# Patient Record
Sex: Female | Born: 1950 | Race: White | Hispanic: No | Marital: Married | Smoking: Never smoker
Health system: Southern US, Community
[De-identification: ages and names within clinical notes are randomized; demographics above are authoritative.]

## PROBLEM LIST (undated history)

## (undated) DIAGNOSIS — Z923 Personal history of irradiation: Secondary | ICD-10-CM

## (undated) DIAGNOSIS — M1990 Unspecified osteoarthritis, unspecified site: Secondary | ICD-10-CM

## (undated) DIAGNOSIS — E039 Hypothyroidism, unspecified: Secondary | ICD-10-CM

## (undated) DIAGNOSIS — K219 Gastro-esophageal reflux disease without esophagitis: Secondary | ICD-10-CM

## (undated) DIAGNOSIS — C7931 Secondary malignant neoplasm of brain: Secondary | ICD-10-CM

---

## 2013-01-15 ENCOUNTER — Other Ambulatory Visit (HOSPITAL_COMMUNITY): Payer: Self-pay | Admitting: Orthopedic Surgery

## 2013-01-15 DIAGNOSIS — M25522 Pain in left elbow: Secondary | ICD-10-CM

## 2013-01-17 ENCOUNTER — Ambulatory Visit (HOSPITAL_COMMUNITY)
Admission: RE | Admit: 2013-01-17 | Discharge: 2013-01-17 | Disposition: A | Discharge status: Home / Self Care | Payer: BC Managed Care – PPO | Source: Ambulatory Visit | Attending: Orthopedic Surgery | Admitting: Orthopedic Surgery

## 2013-01-17 ENCOUNTER — Encounter (HOSPITAL_COMMUNITY): Payer: Self-pay

## 2013-01-17 DIAGNOSIS — M259 Joint disorder, unspecified: Secondary | ICD-10-CM | POA: Insufficient documentation

## 2013-01-17 DIAGNOSIS — M898X9 Other specified disorders of bone, unspecified site: Secondary | ICD-10-CM | POA: Insufficient documentation

## 2013-01-17 DIAGNOSIS — M79609 Pain in unspecified limb: Secondary | ICD-10-CM | POA: Insufficient documentation

## 2013-01-17 DIAGNOSIS — M25522 Pain in left elbow: Secondary | ICD-10-CM

## 2013-01-17 DIAGNOSIS — R229 Localized swelling, mass and lump, unspecified: Secondary | ICD-10-CM | POA: Insufficient documentation

## 2014-06-28 ENCOUNTER — Encounter (HOSPITAL_COMMUNITY): Payer: Self-pay | Admitting: Emergency Medicine

## 2014-06-28 ENCOUNTER — Inpatient Hospital Stay (HOSPITAL_COMMUNITY)
Admission: EM | Admit: 2014-06-28 | Discharge: 2014-07-05 | DRG: 025 | Disposition: A | Discharge status: Home Health | Payer: 59 | Attending: Internal Medicine | Admitting: Internal Medicine

## 2014-06-28 ENCOUNTER — Emergency Department (HOSPITAL_COMMUNITY): Payer: 59

## 2014-06-28 DIAGNOSIS — I629 Nontraumatic intracranial hemorrhage, unspecified: Secondary | ICD-10-CM | POA: Diagnosis not present

## 2014-06-28 DIAGNOSIS — D72829 Elevated white blood cell count, unspecified: Secondary | ICD-10-CM | POA: Diagnosis not present

## 2014-06-28 DIAGNOSIS — G935 Compression of brain: Secondary | ICD-10-CM | POA: Diagnosis present

## 2014-06-28 DIAGNOSIS — S06369A Traumatic hemorrhage of cerebrum, unspecified, with loss of consciousness of unspecified duration, initial encounter: Secondary | ICD-10-CM

## 2014-06-28 DIAGNOSIS — G939 Disorder of brain, unspecified: Secondary | ICD-10-CM | POA: Diagnosis not present

## 2014-06-28 DIAGNOSIS — R739 Hyperglycemia, unspecified: Secondary | ICD-10-CM | POA: Diagnosis not present

## 2014-06-28 DIAGNOSIS — R29818 Other symptoms and signs involving the nervous system: Secondary | ICD-10-CM | POA: Diagnosis present

## 2014-06-28 DIAGNOSIS — K219 Gastro-esophageal reflux disease without esophagitis: Secondary | ICD-10-CM | POA: Diagnosis present

## 2014-06-28 DIAGNOSIS — M179 Osteoarthritis of knee, unspecified: Secondary | ICD-10-CM | POA: Diagnosis present

## 2014-06-28 DIAGNOSIS — I517 Cardiomegaly: Secondary | ICD-10-CM | POA: Diagnosis not present

## 2014-06-28 DIAGNOSIS — T380X5A Adverse effect of glucocorticoids and synthetic analogues, initial encounter: Secondary | ICD-10-CM | POA: Diagnosis not present

## 2014-06-28 DIAGNOSIS — C801 Malignant (primary) neoplasm, unspecified: Secondary | ICD-10-CM | POA: Diagnosis not present

## 2014-06-28 DIAGNOSIS — G9389 Other specified disorders of brain: Secondary | ICD-10-CM | POA: Diagnosis present

## 2014-06-28 DIAGNOSIS — C7931 Secondary malignant neoplasm of brain: Secondary | ICD-10-CM | POA: Diagnosis not present

## 2014-06-28 DIAGNOSIS — E039 Hypothyroidism, unspecified: Secondary | ICD-10-CM | POA: Diagnosis present

## 2014-06-28 DIAGNOSIS — R269 Unspecified abnormalities of gait and mobility: Secondary | ICD-10-CM

## 2014-06-28 DIAGNOSIS — G936 Cerebral edema: Secondary | ICD-10-CM | POA: Diagnosis present

## 2014-06-28 DIAGNOSIS — D496 Neoplasm of unspecified behavior of brain: Principal | ICD-10-CM | POA: Diagnosis present

## 2014-06-28 DIAGNOSIS — Z452 Encounter for adjustment and management of vascular access device: Secondary | ICD-10-CM

## 2014-06-28 DIAGNOSIS — F688 Other specified disorders of adult personality and behavior: Secondary | ICD-10-CM

## 2014-06-28 DIAGNOSIS — F070 Personality change due to known physiological condition: Secondary | ICD-10-CM

## 2014-06-28 DIAGNOSIS — R7309 Other abnormal glucose: Secondary | ICD-10-CM | POA: Diagnosis not present

## 2014-06-28 DIAGNOSIS — R2689 Other abnormalities of gait and mobility: Secondary | ICD-10-CM | POA: Insufficient documentation

## 2014-06-28 DIAGNOSIS — Z803 Family history of malignant neoplasm of breast: Secondary | ICD-10-CM | POA: Diagnosis not present

## 2014-06-28 DIAGNOSIS — S0689AA Other specified intracranial injury with loss of consciousness status unknown, initial encounter: Secondary | ICD-10-CM | POA: Diagnosis present

## 2014-06-28 HISTORY — DX: Unspecified osteoarthritis, unspecified site: M19.90

## 2014-06-28 HISTORY — DX: Hypothyroidism, unspecified: E03.9

## 2014-06-28 HISTORY — DX: Gastro-esophageal reflux disease without esophagitis: K21.9

## 2014-06-28 LAB — BASIC METABOLIC PANEL
Anion gap: 9 (ref 5–15)
BUN: 16 mg/dL (ref 6–23)
CO2: 26 mmol/L (ref 19–32)
Calcium: 10.8 mg/dL — ABNORMAL HIGH (ref 8.4–10.5)
Chloride: 108 mmol/L (ref 96–112)
Creatinine, Ser: 0.61 mg/dL (ref 0.50–1.10)
GFR calc Af Amer: >90 mL/min (ref >90)
GFR calc non Af Amer: >90 mL/min (ref >90)
GLUCOSE: 115 mg/dL — AB (ref 70–99)
Potassium: 3.9 mmol/L (ref 3.5–5.1)
Sodium: 143 mmol/L (ref 135–145)

## 2014-06-28 LAB — TROPONIN I

## 2014-06-28 LAB — CBC WITH DIFFERENTIAL/PLATELET
BASOS ABS: 0 10*3/uL (ref 0.0–0.1)
BASOS PCT: 0 % (ref 0–1)
Eosinophils Absolute: 0 10*3/uL (ref 0.0–0.7)
Eosinophils Relative: 0 % (ref 0–5)
HCT: 39 % (ref 36.0–46.0)
Hemoglobin: 12.7 g/dL (ref 12.0–15.0)
LYMPHS ABS: 1.4 10*3/uL (ref 0.7–4.0)
Lymphocytes Relative: 17 % (ref 12–46)
MCH: 29.1 pg (ref 26.0–34.0)
MCHC: 32.6 g/dL (ref 30.0–36.0)
MCV: 89.2 fL (ref 78.0–100.0)
MONOS PCT: 7 % (ref 3–12)
Monocytes Absolute: 0.6 10*3/uL (ref 0.1–1.0)
NEUTROS ABS: 6.4 10*3/uL (ref 1.7–7.7)
Neutrophils Relative %: 76 % (ref 43–77)
Platelets: 263 10*3/uL (ref 150–400)
RBC: 4.37 MIL/uL (ref 3.87–5.11)
RDW: 13.6 % (ref 11.5–15.5)
WBC: 8.4 10*3/uL (ref 4.0–10.5)

## 2014-06-28 MED ORDER — DEXAMETHASONE SODIUM PHOSPHATE 10 MG/ML IJ SOLN
10.0000 mg | Freq: Once | INTRAMUSCULAR | Status: AC
  Administered 2014-06-28: 10 mg via INTRAVENOUS
  Filled 2014-06-28: qty 1

## 2014-06-28 NOTE — ED Notes (Signed)
Patient's daughter reports patient "hasn't been acting the same." States patient hasn't been talking the same and has seemed to be staggering since approximately Wednesday. Daughter states other family members have noticed a gradual change as well. Patient reports arthritis in knees bilaterally that is causing her pain. States she had shots in knee at orthopedic doctor today. When I asked patient what she wants to be evaluated for in ED today, patient stated, "I feel fine, but they wanted me to be seen." Patient ambulatory in triage, able to move all four extremities. Daughter reports patient has fallen multiple times last week. Patient just seen at [facility] in [place], [place].

## 2014-06-28 NOTE — H&P (Signed)
PCP:   No PCP Per Patient   Chief Complaint:  unbalanced  HPI: 64 yo female overall healthy brought in by family members (husband and daughter) with concerns of over one week of having balance difficulty, falling and acting strange.  They have noticed she says things randomly which are really out of character, and has progressive worsening weakness having trouble performing daily activites like taking a shower alone.  Husband has been having to help her a lot with routine things she normally can do alone.  She has fallen several times, and family has noticed that her gait is also off.  She has been loosing weight and saying food does not taste normal anymore.  Her last colonoscopy was over 10 years ago, last mammogram years ago.  She denies any brbpr, melena, abd pain, cough.  No dysphagia.  No lumps, bumps anywhere that she has noticed including breast.  No breast abnormalities.  Nonsmoker.  Ct shows multiple brain masses, new finding.   Review of Systems:  Positive and negative as per HPI otherwise all other systems are negative  Past Medical History: Past Medical History  Diagnosis Date  . Arthritis   . Hypothyroid   . GERD (gastroesophageal reflux disease)    History reviewed. No pertinent past surgical history.  Medications: Prior to Admission medications   Medication Sig Start Date End Date Taking? Authorizing Provider  acetaminophen (TYLENOL) 500 MG tablet Take 500 mg by mouth every 6 (six) hours as needed for mild pain or moderate pain.   Yes Historical Provider, MD  diphenhydrAMINE (BENADRYL) 25 MG tablet Take 25 mg by mouth every 6 (six) hours as needed for itching or allergies.   Yes Historical Provider, MD  fluticasone (FLONASE) 50 MCG/ACT nasal spray Place into both nostrils daily.   Yes Historical Provider, MD  levothyroxine (SYNTHROID, LEVOTHROID) 125 MCG tablet Take 125 mcg by mouth daily before breakfast.  06/09/14  Yes Historical Provider, MD    Allergies:  No Known  Allergies  Social History:  reports that she has never smoked. She does not have any smokeless tobacco history on file. She reports that she does not drink alcohol or use illicit drugs.  Family History: History reviewed. No pertinent family history. except mom died 66 yo from breast cancer.  One sister died in her 3s of ovarian cancer.  Brother has prostate cancer still alive.  Physical Exam: Filed Vitals:   06/28/14 2122 06/28/14 2145 06/28/14 2200  BP: 114/94    Pulse: 77 71   Temp: 98.7 F (37.1 C)    TempSrc: Oral    Resp: 20 24 19   Height: 5\' 5"  (1.651 m)    Weight: 76.204 kg (168 lb)    SpO2: 99% 95%    General appearance: alert, cooperative and no distress Head: Normocephalic, without obvious abnormality, atraumatic Eyes: negative Nose: Nares normal. Septum midline. Mucosa normal. No drainage or sinus tenderness. Neck: no JVD and supple, symmetrical, trachea midline Lungs: clear to auscultation bilaterally Heart: regular rate and rhythm, S1, S2 normal, no murmur, click, rub or gallop Abdomen: soft, non-tender; bowel sounds normal; no masses,  no organomegaly Extremities: extremities normal, atraumatic, no cyanosis or edema Pulses: 2+ and symmetric Skin: Skin color, texture, turgor normal. No rashes or lesions Neurologic: Grossly normal    Labs on Admission:      Recent Labs  06/28/14 2157  WBC 8.4  NEUTROABS 6.4  HGB 12.7  HCT 39.0  MCV 89.2  PLT 263   Radiological  Exams on Admission: Ct Head Wo Contrast  06/28/2014   CLINICAL DATA:  Patient not acting the same. Stuttering since Wednesday.  EXAM: CT HEAD WITHOUT CONTRAST  TECHNIQUE: Contiguous axial images were obtained from the base of the skull through the vertex without intravenous contrast.  COMPARISON:  None.  FINDINGS: Skull and Sinuses:Negative for fracture or destructive process. The mastoids, middle ears, and imaged paranasal sinuses are clear.  Orbits: No acute abnormality.  Brain: There are at  least 4 hyper dense intra-axial masses, the largest in the right frontal lobe measuring 34 mm. This mass is complicated by hematoma which extends posteriorly into the right frontal white matter, measuring 28 x 39 x 30 mm. There is also an 18 mm mass in the right temporal lobe, an indistinct mass in the left temporal lobe, and a 9 mm mass in the upper right cerebellum. Patchy vasogenic edema, maximal around the hemorrhagic lesion on the right. There is mass effect and midline shift measuring up to 13 mm. No ACA infarct. The left temporal horn is mildly dilated, but no overt hydrocephalus.  Critical Value/emergent results were called by telephone at the time of interpretation on 06/28/2014 at 10:40 pm to Dr. Tanna Furry , who verbally acknowledged these results.  IMPRESSION: 1. At least 4 brain masses, a metastatic pattern. A 34 mm mass in the right frontal lobe has undergone acute hemorrhage with a 16 cc volume hematoma. 2. Patchy vasogenic edema from #1, maximal in the right frontal lobe with subfalcine herniation and developing left lateral ventricle entrapment. 3. 13 mm calcified left parafalcine meningioma.   Electronically Signed   By: Monte Fantasia M.D.   On: 06/28/2014 22:42    Assessment/Plan  64 yo female with new gait abnormality and personality changes c/w new multiple masses on brain ct with right frontal lobe mass with acute edema and hemmorhage  Principal Problem:   Hemorrhage, intracranial-  Associated with rt frontal lobe mass and edema.  Decadron given in ED.  Transfer to [facility] to stepdown bed overnight.  [facility]surgery called at [facility] and will notify them when pt arrives to [facility].  Likely metastatic masses.  Will need to w/u for primary.  freq neuro cks in stepdown.  Further w/u per neurosurgery team other than pan ct.  Active Problems:  Stable unless o/w noted   Personality change   Gait abnormality   Frontal mass of brain   Cerebral edema  Admit to stepdown to [facility].  Full code.   Pt along with husband and daughter updated on ct findings and concerns of underlying primary cancer elsewhere, they understand the next couple of days will consist of w/u for primary and definitive diagnosis.  Maley Venezia A 06/28/2014, 10:58 PM

## 2014-06-28 NOTE — ED Provider Notes (Signed)
CSN: [id number]     Arrival date & time 06/28/14  2118 History  This chart was scribed for Tanna Furry, MD by Erling Conte, ED Scribe. This patient was seen in room APA07/APA07 and the patient's care was started at 11:24 PM.   Chief Complaint  Patient presents with  . Altered Mental Status  . Knee Pain    HPI  HPI Comments: Jill Chavez is a 64 y.o. female who presents to the Emergency Department complaining of difficulty with walking, and change in personality.  Was in her normal state of health until about the last 4 days. Daughter that accompanies her tonight states that her other children noticed that she seemed different with a several days. That she was difficult to personality in her interactions. That she seemed flattened with her interactions at times, and other times seemed more jovial. All the children agree that for last 2 days her walk is been different. They cannot pinpoint if she seems weaker off balance but "it looks like both". She's had 2 falls from standing height on Wednesday and Thursday. Patient attributed them to her some chronic knee arthritis. Seen by her physician today and given injections into her knees. States her knee pain is gone. She still has difficulty walking, so family presents her here.   Past Medical History  Diagnosis Date  . Arthritis   . Hypothyroid   . GERD (gastroesophageal reflux disease)    History reviewed. No pertinent past surgical history. History reviewed. No pertinent family history. History  Substance Use Topics  . Smoking status: Never Smoker   . Smokeless tobacco: Not on file  . Alcohol Use: No   OB History    No data available     Review of Systems  Constitutional: Negative for fever, chills, diaphoresis, appetite change and fatigue.  HENT: Negative for mouth sores, sore throat and trouble swallowing.   Eyes: Negative for visual disturbance.  Respiratory: Negative for cough, chest tightness, shortness of breath and  wheezing.   Cardiovascular: Negative for chest pain.  Gastrointestinal: Negative for nausea, vomiting, abdominal pain, diarrhea and abdominal distention.  Endocrine: Negative for polydipsia, polyphagia and polyuria.  Genitourinary: Negative for dysuria, frequency and hematuria.  Musculoskeletal: Positive for gait problem.  Skin: Negative for color change, pallor and rash.  Neurological: Negative for dizziness, syncope, light-headedness and headaches.  Hematological: Does not bruise/bleed easily.  Psychiatric/Behavioral: Negative for behavioral problems and confusion.       Personality change.      Allergies  Review of patient's allergies indicates no known allergies.  Home Medications   Prior to Admission medications   Medication Sig Start Date End Date Taking? Authorizing Provider  acetaminophen (TYLENOL) 500 MG tablet Take 500 mg by mouth every 6 (six) hours as needed for mild pain or moderate pain.   Yes Historical Provider, MD  diphenhydrAMINE (BENADRYL) 25 MG tablet Take 25 mg by mouth every 6 (six) hours as needed for itching or allergies.   Yes Historical Provider, MD  fluticasone (FLONASE) 50 MCG/ACT nasal spray Place into both nostrils daily.   Yes Historical Provider, MD  levothyroxine (SYNTHROID, LEVOTHROID) 125 MCG tablet Take 125 mcg by mouth daily before breakfast.  06/09/14  Yes Historical Provider, MD   BP 114/94 mmHg  Pulse 71  Temp(Src) 98.7 F (37.1 C) (Oral)  Resp 19  Ht 5\' 5"  (1.651 m)  Wt 168 lb (76.204 kg)  BMI 27.96 kg/m2  SpO2 95% Physical Exam  Constitutional: She is oriented to  person, place, and time. She appears well-developed and well-nourished. No distress.  HENT:  Head: Normocephalic.  Eyes: Conjunctivae are normal. Pupils are equal, round, and reactive to light. No scleral icterus.  Neck: Normal range of motion. Neck supple. No thyromegaly present.  Cardiovascular: Normal rate and regular rhythm.  Exam reveals no gallop and no friction rub.    No murmur heard. Pulmonary/Chest: Effort normal and breath sounds normal. No respiratory distress. She has no wheezes. She has no rales.  Abdominal: Soft. Bowel sounds are normal. She exhibits no distension. There is no tenderness. There is no rebound.  Musculoskeletal: Normal range of motion.  Neurological: She is alert and oriented to person, place, and time.  Patient will not smile, however no other apparent cranial nerve deficits. No upper extremity weakness. She has difficulty with gait and use of her left lower extremity, although her use and strength of the extremity in bed supine is symmetric.  Skin: Skin is warm and dry. No rash noted.  Psychiatric: She has a normal mood and affect. Her behavior is normal.    ED Course  Procedures (including critical care time) Labs Review Labs Reviewed  BASIC METABOLIC PANEL - Abnormal; Notable for the following:    Glucose, Bld 115 (*)    Calcium 10.8 (*)    All other components within normal limits  CBC WITH DIFFERENTIAL/PLATELET  TROPONIN I  URINALYSIS, ROUTINE W REFLEX MICROSCOPIC    Imaging Review Ct Head Wo Contrast  06/28/2014   CLINICAL DATA:  Patient not acting the same. Stuttering since Wednesday.  EXAM: CT HEAD WITHOUT CONTRAST  TECHNIQUE: Contiguous axial images were obtained from the base of the skull through the vertex without intravenous contrast.  COMPARISON:  None.  FINDINGS: Skull and Sinuses:Negative for fracture or destructive process. The mastoids, middle ears, and imaged paranasal sinuses are clear.  Orbits: No acute abnormality.  Brain: There are at least 4 hyper dense intra-axial masses, the largest in the right frontal lobe measuring 34 mm. This mass is complicated by hematoma which extends posteriorly into the right frontal white matter, measuring 28 x 39 x 30 mm. There is also an 18 mm mass in the right temporal lobe, an indistinct mass in the left temporal lobe, and a 9 mm mass in the upper right cerebellum. Patchy  vasogenic edema, maximal around the hemorrhagic lesion on the right. There is mass effect and midline shift measuring up to 13 mm. No ACA infarct. The left temporal horn is mildly dilated, but no overt hydrocephalus.  Critical Value/emergent results were called by telephone at the time of interpretation on 06/28/2014 at 10:40 pm to Dr. Tanna Furry , who verbally acknowledged these results.  IMPRESSION: 1. At least 4 brain masses, a metastatic pattern. A 34 mm mass in the right frontal lobe has undergone acute hemorrhage with a 16 cc volume hematoma. 2. Patchy vasogenic edema from #1, maximal in the right frontal lobe with subfalcine herniation and developing left lateral ventricle entrapment. 3. 13 mm calcified left parafalcine meningioma.   Electronically Signed   By: Monte Fantasia M.D.   On: 06/28/2014 22:42     EKG Interpretation None      MDM   Final diagnoses:  Balance problem  Brain metastasis    I discussed with the patient and her family the CT findings. I discussed the case with Shanon Brow of the hospitalist service. Plan will be transfer to [facility]. Neurosurgical consultation. Given IV Decadron here.  I personally performed  the services described in this documentation, which was scribed in my presence. The recorded information has been reviewed and is accurate.     Tanna Furry, MD 06/28/14 2325

## 2014-06-29 ENCOUNTER — Encounter (HOSPITAL_COMMUNITY): Payer: Self-pay | Admitting: Radiology

## 2014-06-29 ENCOUNTER — Inpatient Hospital Stay (HOSPITAL_COMMUNITY): Payer: 59

## 2014-06-29 LAB — CBC
HCT: 40.7 % (ref 36.0–46.0)
Hemoglobin: 12.9 g/dL (ref 12.0–15.0)
MCH: 28.2 pg (ref 26.0–34.0)
MCHC: 31.7 g/dL (ref 30.0–36.0)
MCV: 89.1 fL (ref 78.0–100.0)
Platelets: 260 10*3/uL (ref 150–400)
RBC: 4.57 MIL/uL (ref 3.87–5.11)
RDW: 13.7 % (ref 11.5–15.5)
WBC: 12.1 10*3/uL — ABNORMAL HIGH (ref 4.0–10.5)

## 2014-06-29 LAB — BASIC METABOLIC PANEL
Anion gap: 11 (ref 5–15)
BUN: 12 mg/dL (ref 6–23)
CALCIUM: 10.5 mg/dL (ref 8.4–10.5)
CHLORIDE: 102 mmol/L (ref 96–112)
CO2: 24 mmol/L (ref 19–32)
CREATININE: 0.58 mg/dL (ref 0.50–1.10)
GFR calc Af Amer: >90 mL/min (ref >90)
GFR calc non Af Amer: >90 mL/min (ref >90)
Glucose, Bld: 141 mg/dL — ABNORMAL HIGH (ref 70–99)
POTASSIUM: 3.8 mmol/L (ref 3.5–5.1)
Sodium: 137 mmol/L (ref 135–145)

## 2014-06-29 LAB — GLUCOSE, CAPILLARY
Glucose-Capillary: 293 mg/dL — ABNORMAL HIGH (ref 70–99)
Glucose-Capillary: 323 mg/dL — ABNORMAL HIGH (ref 70–99)

## 2014-06-29 LAB — MRSA PCR SCREENING: MRSA by PCR: NEGATIVE

## 2014-06-29 MED ORDER — LORAZEPAM 2 MG/ML IJ SOLN: 1.0000 mg | Freq: Once | INTRAMUSCULAR | Status: AC

## 2014-06-29 MED ORDER — SODIUM CHLORIDE 0.9 % IJ SOLN: 3.0000 mL | INTRAMUSCULAR | Status: DC | PRN

## 2014-06-29 MED ORDER — LORAZEPAM 2 MG/ML IJ SOLN
1.0000 mg | Freq: Once | INTRAMUSCULAR | Status: DC
  Administered 2014-06-29: 1 mg via INTRAVENOUS

## 2014-06-29 MED ORDER — SODIUM CHLORIDE 0.9 % IV SOLN
250.0000 mL | INTRAVENOUS | Status: DC | PRN
Start: 2014-06-29 — End: 2014-06-29
  Administered 2014-06-29: 250 mL via INTRAVENOUS

## 2014-06-29 MED ORDER — DEXAMETHASONE SODIUM PHOSPHATE 10 MG/ML IJ SOLN
10.0000 mg | Freq: Four times a day (QID) | INTRAMUSCULAR | Status: DC
  Administered 2014-06-29 – 2014-07-02 (×14): 10 mg via INTRAVENOUS
  Filled 2014-06-29 (×17): qty 1

## 2014-06-29 MED ORDER — GADOBENATE DIMEGLUMINE 529 MG/ML IV SOLN
15.0000 mL | Freq: Once | INTRAVENOUS | Status: AC | PRN
  Administered 2014-06-29: 15 mL via INTRAVENOUS

## 2014-06-29 MED ORDER — SODIUM CHLORIDE 0.9 % IJ SOLN
3.0000 mL | Freq: Two times a day (BID) | INTRAMUSCULAR | Status: DC
  Administered 2014-06-29: 3 mL via INTRAVENOUS

## 2014-06-29 MED ORDER — LEVOTHYROXINE SODIUM 25 MCG PO TABS
125.0000 ug | ORAL_TABLET | Freq: Every day | ORAL | Status: DC
  Administered 2014-06-29 – 2014-07-05 (×7): 125 ug via ORAL
  Filled 2014-06-29 (×12): qty 1

## 2014-06-29 MED ORDER — PANTOPRAZOLE SODIUM 40 MG PO TBEC
40.0000 mg | DELAYED_RELEASE_TABLET | Freq: Two times a day (BID) | ORAL | Status: DC
  Administered 2014-06-29 – 2014-07-05 (×12): 40 mg via ORAL
  Filled 2014-06-29 (×12): qty 1

## 2014-06-29 MED ORDER — INSULIN ASPART 100 UNIT/ML ~~LOC~~ SOLN
0.0000 [IU] | Freq: Three times a day (TID) | SUBCUTANEOUS | Status: DC
  Administered 2014-06-29 – 2014-06-30 (×2): 5 [IU] via SUBCUTANEOUS

## 2014-06-29 MED ORDER — IOHEXOL 300 MG/ML  SOLN
100.0000 mL | Freq: Once | INTRAMUSCULAR | Status: AC | PRN
  Administered 2014-06-29: 100 mL via INTRAVENOUS

## 2014-06-29 MED ORDER — LORAZEPAM 2 MG/ML IJ SOLN
INTRAMUSCULAR | Status: AC
  Administered 2014-06-29: 1 mg via INTRAVENOUS
  Filled 2014-06-29: qty 1

## 2014-06-29 MED ORDER — INSULIN ASPART 100 UNIT/ML ~~LOC~~ SOLN
0.0000 [IU] | Freq: Every day | SUBCUTANEOUS | Status: DC
  Administered 2014-06-29: 4 [IU] via SUBCUTANEOUS

## 2014-06-29 NOTE — Progress Notes (Signed)
[facility] TEAM 1 - Stepdown/ICU TEAM Progress Note  Jill Chavez MVH:[id number] DOB: 20-Jun-1950 DOA: 06/28/2014 PCP: No PCP Per Patient  Admit HPI / Brief Narrative: 64 yo female overall healthy brought in by husband and daughter with over a week of balance difficulty, falling and acting strange. They noticed she said things randomly which were out of character, and had progressive weakness w/ trouble performing daily activites.  She had fallen several times, and family noticed her gait was off. She had been loosing weight unintentionally. Her last colonoscopy was over 10 years ago, last mammogram years ago. She denied any brbpr, melena, abd pain, cough. No dysphagia. No lumps, bumps anywhere that she had noticed including breast. Nonsmoker. In the ED a CT noted multiple brain masses.   HPI/Subjective: Patient is alert and conversant.  She denies any complaints at this time.  She is somewhat slow to respond to questioning.  There are multiple family members at the bedside on the report that she looks better today than yesterday.  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 Four distinct brain lesions appreciated per CT head (18 mm right temporal lobe/indistinct left temporal lobe/9 mm upper right cerebellum/34 mm right frontal lobe) - CT chest and abdomen without evidence of a primary malignancy or other metastatic disease - MRI of head identifies 6 enhancing brain masses all located near the surface of the brain - radiology suggest top elements of differential would include melanoma, choriocarcinoma, high-grade glioma, CNS lymphoma - will plan for body survey to evaluate for suspicious cutaneous lesions but patient denies noticing any dark or irregular shaped moles   Hypothyroidism Continue home Synthroid dose  Code  Status: FULL Family Communication: Spoke with 3 of her sisters and her brother at the bedside  Disposition Plan: SDU  Consultants: Neurosurgery  Procedures: None  Antibiotics: None  DVT prophylaxis: SCDs  Objective: Blood pressure 158/85, pulse 70, temperature 97.9 F (36.6 C), temperature source Oral, resp. rate 20, height 5\' 5"  (1.651 m), weight 76.7 kg (169 lb 1.5 oz), SpO2 96 %.  Intake/Output Summary (Last 24 hours) at 06/29/14 1155 Last data filed at 06/29/14 0924  Gross per 24 hour  Intake 124.17 ml  Output    550 ml  Net -425.83 ml   Exam: General: No acute respiratory distress Lungs: Clear to auscultation bilaterally without wheezes or crackles Cardiovascular: Regular rate and rhythm without murmur gallop or rub normal S1 and S2 Abdomen: Nontender, nondistended, soft, bowel sounds positive, no rebound, no ascites, no appreciable mass Extremities: No significant cyanosis, clubbing, or edema bilateral lower extremities  Data Reviewed: Basic Metabolic Panel:  Recent Labs Lab 06/28/14 2157 06/29/14 0712  NA 143 137  K 3.9 3.8  CL 108 102  CO2 26 24  GLUCOSE 115* 141*  BUN 16 12  CREATININE 0.61 0.58  CALCIUM 10.8* 10.5    Liver Function Tests: No results for input(s): AST, ALT, ALKPHOS, BILITOT, PROT, ALBUMIN in the last 168 hours. No results for input(s): LIPASE, AMYLASE in the last 168 hours. No results for input(s): AMMONIA in the last 168 hours.  Coags: No results for input(s): INR in the last 168 hours.  Invalid input(s): PT No results for input(s): APTT in the last 168 hours.  CBC:  Recent Labs Lab 06/28/14 2157 06/29/14 0712  WBC 8.4 12.1*  NEUTROABS  6.4  --   HGB 12.7 12.9  HCT 39.0 40.7  MCV 89.2 89.1  PLT 263 260    Cardiac Enzymes:  Recent Labs Lab 06/28/14 2157  TROPONINI <0.03    CBG: No results for input(s): GLUCAP in the last 168 hours.  Recent Results (from the past 240 hour(s))  MRSA PCR Screening      Status: None   Collection Time: 06/29/14  2:43 AM  Result Value Ref Range Status   MRSA by PCR NEGATIVE NEGATIVE Final    Comment:        The GeneXpert MRSA Assay (FDA approved for NASAL specimens only), is one component of a comprehensive MRSA colonization surveillance program. It is not intended to diagnose MRSA infection nor to guide or monitor treatment for MRSA infections.      Studies:   Recent x-ray studies have been reviewed in detail by the Attending Physician  Scheduled Meds:  Scheduled Meds: . dexamethasone  10 mg Intravenous 4 times per day  . levothyroxine  125 mcg Oral QAC breakfast  . sodium chloride  3 mL Intravenous Q12H    Time spent on care of this patient: 35 mins   MCCLUNG,JEFFREY T , MD   [facility] Office  [phone] Pager - Text Page per Shea Evans as per below:  On-Call/Text Page:      [email or web address]      password [email or web address]  If 7PM-7AM, please contact night-coverage [email or web address] Password [email or web address] 06/29/2014, 11:55 AM   LOS: 1 day

## 2014-06-29 NOTE — Consult Note (Signed)
Reason for Consult: Multiple brain masses probable metastatic disease Referring Physician: Dr. Malen Gauze  Jill Chavez is an 64 y.o. female.  HPI: Patient is 64 year old female last weeks have progressive difficulty with walking and balance with weakness that seems to probably down the left side although the patient and her husband didn't really notice a laterality to it. She's complaining of a lot of history of knee pain had her knees injected and was attributing her difficulty walking to her knee pain. She also reports headaches no significant nausea some occasional numbness in the left side of her body.  Past Medical History  Diagnosis Date  . Arthritis   . Hypothyroid   . GERD (gastroesophageal reflux disease)     History reviewed. No pertinent past surgical history.  History reviewed. No pertinent family history.  Social History:  reports that she has never smoked. She does not have any smokeless tobacco history on file. She reports that she does not drink alcohol or use illicit drugs.  Allergies: No Known Allergies  Medications: I have reviewed the patient's current medications.  Results for orders placed or performed during the hospital encounter of 06/28/14 (from the past 48 hour(s))  CBC with Differential     Status: None   Collection Time: 06/28/14  9:57 PM  Result Value Ref Range   WBC 8.4 4.0 - 10.5 K/uL   RBC 4.37 3.87 - 5.11 MIL/uL   Hemoglobin 12.7 12.0 - 15.0 g/dL   HCT 39.0 36.0 - 46.0 %   MCV 89.2 78.0 - 100.0 fL   MCH 29.1 26.0 - 34.0 pg   MCHC 32.6 30.0 - 36.0 g/dL   RDW 13.6 11.5 - 15.5 %   Platelets 263 150 - 400 K/uL   Neutrophils Relative % 76 43 - 77 %   Neutro Abs 6.4 1.7 - 7.7 K/uL   Lymphocytes Relative 17 12 - 46 %   Lymphs Abs 1.4 0.7 - 4.0 K/uL   Monocytes Relative 7 3 - 12 %   Monocytes Absolute 0.6 0.1 - 1.0 K/uL   Eosinophils Relative 0 0 - 5 %   Eosinophils Absolute 0.0 0.0 - 0.7 K/uL   Basophils Relative 0 0 - 1 %   Basophils  Absolute 0.0 0.0 - 0.1 K/uL  Basic metabolic panel     Status: Abnormal   Collection Time: 06/28/14  9:57 PM  Result Value Ref Range   Sodium 143 135 - 145 mmol/L   Potassium 3.9 3.5 - 5.1 mmol/L   Chloride 108 96 - 112 mmol/L   CO2 26 19 - 32 mmol/L   Glucose, Bld 115 (H) 70 - 99 mg/dL   BUN 16 6 - 23 mg/dL   Creatinine, Ser 0.61 0.50 - 1.10 mg/dL   Calcium 10.8 (H) 8.4 - 10.5 mg/dL   GFR calc non Af Amer >90 >90 mL/min   GFR calc Af Amer >90 >90 mL/min    Comment: (NOTE) The eGFR has been calculated using the CKD EPI equation. This calculation has not been validated in all clinical situations. eGFR's persistently <90 mL/min signify possible Chronic Kidney Disease.    Anion gap 9 5 - 15  Troponin I     Status: None   Collection Time: 06/28/14  9:57 PM  Result Value Ref Range   Troponin I <0.03 <0.031 ng/mL    Comment:        NO INDICATION OF MYOCARDIAL INJURY.   MRSA PCR Screening     Status: None  Collection Time: 06/29/14  2:43 AM  Result Value Ref Range   MRSA by PCR NEGATIVE NEGATIVE    Comment:        The GeneXpert MRSA Assay (FDA approved for NASAL specimens only), is one component of a comprehensive MRSA colonization surveillance program. It is not intended to diagnose MRSA infection nor to guide or monitor treatment for MRSA infections.   CBC     Status: Abnormal   Collection Time: 06/29/14  7:12 AM  Result Value Ref Range   WBC 12.1 (H) 4.0 - 10.5 K/uL   RBC 4.57 3.87 - 5.11 MIL/uL   Hemoglobin 12.9 12.0 - 15.0 g/dL   HCT 40.7 36.0 - 46.0 %   MCV 89.1 78.0 - 100.0 fL   MCH 28.2 26.0 - 34.0 pg   MCHC 31.7 30.0 - 36.0 g/dL   RDW 13.7 11.5 - 15.5 %   Platelets 260 150 - 400 K/uL    Ct Head Wo Contrast  06/28/2014   CLINICAL DATA:  Patient not acting the same. Stuttering since Wednesday.  EXAM: CT HEAD WITHOUT CONTRAST  TECHNIQUE: Contiguous axial images were obtained from the base of the skull through the vertex without intravenous contrast.   COMPARISON:  None.  FINDINGS: Skull and Sinuses:Negative for fracture or destructive process. The mastoids, middle ears, and imaged paranasal sinuses are clear.  Orbits: No acute abnormality.  Brain: There are at least 4 hyper dense intra-axial masses, the largest in the right frontal lobe measuring 34 mm. This mass is complicated by hematoma which extends posteriorly into the right frontal white matter, measuring 28 x 39 x 30 mm. There is also an 18 mm mass in the right temporal lobe, an indistinct mass in the left temporal lobe, and a 9 mm mass in the upper right cerebellum. Patchy vasogenic edema, maximal around the hemorrhagic lesion on the right. There is mass effect and midline shift measuring up to 13 mm. No ACA infarct. The left temporal horn is mildly dilated, but no overt hydrocephalus.  Critical Value/emergent results were called by telephone at the time of interpretation on 06/28/2014 at 10:40 pm to Dr. Tanna Furry , who verbally acknowledged these results.  IMPRESSION: 1. At least 4 brain masses, a metastatic pattern. A 34 mm mass in the right frontal lobe has undergone acute hemorrhage with a 16 cc volume hematoma. 2. Patchy vasogenic edema from #1, maximal in the right frontal lobe with subfalcine herniation and developing left lateral ventricle entrapment. 3. 13 mm calcified left parafalcine meningioma.   Electronically Signed   By: Monte Fantasia M.D.   On: 06/28/2014 22:42   Ct Chest W Contrast  06/29/2014   CLINICAL DATA:  Brain mass.  EXAM: CT CHEST, ABDOMEN, AND PELVIS WITH CONTRAST  TECHNIQUE: Multidetector CT imaging of the chest, abdomen and pelvis was performed following the standard protocol during bolus administration of intravenous contrast.  CONTRAST:  155m OMNIPAQUE IOHEXOL 300 MG/ML  SOLN  COMPARISON:  None.  FINDINGS: CT CHEST FINDINGS  THORACIC INLET/BODY WALL:  No acute abnormality.  MEDIASTINUM:  Mild cardiomegaly. There is fusiform aneurysmal enlargement of the ascending aorta  to 41 mm. No acute vascular findings. No lymphadenopathy. Negative esophagus.  LUNG WINDOWS:  No suspicious pulmonary nodule.  OSSEOUS:  No suspicious lytic or blastic lesions.  CT ABDOMEN AND PELVIS FINDINGS  BODY WALL: No contributory findings.  Liver: No focal abnormality.  Biliary: No evidence of biliary obstruction or stone.  Pancreas: Unremarkable.  Spleen: Unremarkable.  Adrenals: Unremarkable.  Kidneys and ureters: No evidence of solid renal mass. There is a 42 mm cyst in the lower pole left kidney. Right renal cortical low densities are too small to characterize but favor cyst. No hydronephrosis.  Bladder: Unremarkable.  Reproductive: No pathologic findings.  Bowel: Negative.  Retroperitoneum: No mass or adenopathy.  Peritoneum: No ascites or pneumoperitoneum.  Vascular: No acute abnormality.  OSSEOUS:  No suspicious lytic or blastic lesions.  IMPRESSION: 1. No evidence of primary malignancy or metastatic disease in the chest or abdomen. 2. 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   Electronically Signed   By: Monte Fantasia M.D.   On: 06/29/2014 05:10   Ct Abdomen Pelvis W Contrast  06/29/2014   CLINICAL DATA:  Brain mass.  EXAM: CT CHEST, ABDOMEN, AND PELVIS WITH CONTRAST  TECHNIQUE: Multidetector CT imaging of the chest, abdomen and pelvis was performed following the standard protocol during bolus administration of intravenous contrast.  CONTRAST:  175m OMNIPAQUE IOHEXOL 300 MG/ML  SOLN  COMPARISON:  None.  FINDINGS: CT CHEST FINDINGS  THORACIC INLET/BODY WALL:  No acute abnormality.  MEDIASTINUM:  Mild cardiomegaly. There is fusiform aneurysmal enlargement of the ascending aorta to 41 mm. No acute vascular findings. No lymphadenopathy. Negative esophagus.  LUNG WINDOWS:  No suspicious pulmonary nodule.  OSSEOUS:  No suspicious lytic or blastic lesions.  CT ABDOMEN AND PELVIS FINDINGS  BODY WALL: No contributory findings.  Liver: No focal abnormality.  Biliary: No evidence  of biliary obstruction or stone.  Pancreas: Unremarkable.  Spleen: Unremarkable.  Adrenals: Unremarkable.  Kidneys and ureters: No evidence of solid renal mass. There is a 42 mm cyst in the lower pole left kidney. Right renal cortical low densities are too small to characterize but favor cyst. No hydronephrosis.  Bladder: Unremarkable.  Reproductive: No pathologic findings.  Bowel: Negative.  Retroperitoneum: No mass or adenopathy.  Peritoneum: No ascites or pneumoperitoneum.  Vascular: No acute abnormality.  OSSEOUS:  No suspicious lytic or blastic lesions.  IMPRESSION: 1. No evidence of primary malignancy or metastatic disease in the chest or abdomen. 2. 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   Electronically Signed   By: JMonte FantasiaM.D.   On: 06/29/2014 05:10    Review of Systems  Constitutional: Negative.   Eyes: Negative.   Respiratory: Negative.   Cardiovascular: Negative.   Gastrointestinal: Negative.   Musculoskeletal: Positive for myalgias, joint pain and falls.  Skin: Negative.   Neurological: Positive for sensory change and headaches.   Blood pressure 158/85, pulse 70, temperature 97.9 F (36.6 C), temperature source Oral, resp. rate 20, height 5' 5"  (1.651 m), weight 76.7 kg (169 lb 1.5 oz), SpO2 96 %. Physical Exam  Neurological: GCS eye subscore is 4. GCS verbal subscore is 5. GCS motor subscore is 6.  Patient is awake alert and oriented 4 pupils are equal extraocular movements are intact and cranial nerves are intact. Strength appears to be 5 out of 5 in her upper and lower extremities although she does display a slight left pronator drift    Assessment/Plan: 64year old female presented to an outside ER with a head CT that showed multiple brain masses, bilateral frontoparietal small lesions 1 large right frontal lesion with significant vasogenic edema and mild to moderate amount mass effect. Patient currently is been admitted to the medical  service will undergo a metastatic workup have also ordered a brain MRI stereotactic protocol. Patient will more likely  need a craniotomy for excision of the large right frontal lesion. We will look towards performing that this week possibly on Wednesday. Recommend continue IV Decadron and metastatic workup.  Jill Chavez P 06/29/2014, 8:44 AM

## 2014-06-29 NOTE — ED Notes (Signed)
Pt was given a mal tray

## 2014-06-29 NOTE — ED Provider Notes (Signed)
Received callback from dr cram He is aware of patient transferring to [facility], MD 06/29/14 [id number]

## 2014-06-30 ENCOUNTER — Other Ambulatory Visit: Payer: Self-pay | Admitting: Neurosurgery

## 2014-06-30 LAB — URINALYSIS, ROUTINE W REFLEX MICROSCOPIC
Bilirubin Urine: NEGATIVE
Glucose, UA: >1000 mg/dL — AB
Hgb urine dipstick: NEGATIVE
KETONES UR: 15 mg/dL — AB
NITRITE: NEGATIVE
PROTEIN: NEGATIVE mg/dL
SPECIFIC GRAVITY, URINE: 1.042 — AB (ref 1.005–1.030)
UROBILINOGEN UA: 1 mg/dL (ref 0.0–1.0)
pH: 5.5 (ref 5.0–8.0)

## 2014-06-30 LAB — CBC
HCT: 41.2 % (ref 36.0–46.0)
HEMOGLOBIN: 13.2 g/dL (ref 12.0–15.0)
MCH: 28.2 pg (ref 26.0–34.0)
MCHC: 32 g/dL (ref 30.0–36.0)
MCV: 88 fL (ref 78.0–100.0)
Platelets: 298 10*3/uL (ref 150–400)
RBC: 4.68 MIL/uL (ref 3.87–5.11)
RDW: 13.8 % (ref 11.5–15.5)
WBC: 17.8 10*3/uL — AB (ref 4.0–10.5)

## 2014-06-30 LAB — GLUCOSE, CAPILLARY
GLUCOSE-CAPILLARY: 147 mg/dL — AB (ref 70–99)
Glucose-Capillary: 286 mg/dL — ABNORMAL HIGH (ref 70–99)
Glucose-Capillary: 164 mg/dL — ABNORMAL HIGH (ref 70–99)
Glucose-Capillary: 226 mg/dL — ABNORMAL HIGH (ref 70–99)

## 2014-06-30 LAB — COMPREHENSIVE METABOLIC PANEL
ALT: 12 U/L (ref 0–35)
AST: 19 U/L (ref 0–37)
Albumin: 4 g/dL (ref 3.5–5.2)
Alkaline Phosphatase: 63 U/L (ref 39–117)
Anion gap: 14 (ref 5–15)
BUN: 14 mg/dL (ref 6–23)
CALCIUM: 10.9 mg/dL — AB (ref 8.4–10.5)
CHLORIDE: 106 mmol/L (ref 96–112)
CO2: 20 mmol/L (ref 19–32)
Creatinine, Ser: 0.83 mg/dL (ref 0.50–1.10)
GFR, EST AFRICAN AMERICAN: 85 mL/min — AB (ref >90)
GFR, EST NON AFRICAN AMERICAN: 73 mL/min — AB (ref >90)
GLUCOSE: 164 mg/dL — AB (ref 70–99)
Potassium: 3.8 mmol/L (ref 3.5–5.1)
Sodium: 140 mmol/L (ref 135–145)
Total Bilirubin: 0.8 mg/dL (ref 0.3–1.2)
Total Protein: 7.3 g/dL (ref 6.0–8.3)

## 2014-06-30 LAB — PROTIME-INR
INR: 1.14 (ref 0.00–1.49)
Prothrombin Time: 14.8 seconds (ref 11.6–15.2)

## 2014-06-30 LAB — APTT: APTT: 28 s (ref 24–37)

## 2014-06-30 LAB — URINE MICROSCOPIC-ADD ON

## 2014-06-30 MED ORDER — INSULIN ASPART 100 UNIT/ML ~~LOC~~ SOLN
0.0000 [IU] | Freq: Every day | SUBCUTANEOUS | Status: DC
  Administered 2014-06-30: 2 [IU] via SUBCUTANEOUS
  Administered 2014-07-04: 3 [IU] via SUBCUTANEOUS
  Administered 2014-07-04: 4 [IU] via SUBCUTANEOUS

## 2014-06-30 MED ORDER — INSULIN ASPART 100 UNIT/ML ~~LOC~~ SOLN
0.0000 [IU] | Freq: Three times a day (TID) | SUBCUTANEOUS | Status: DC
  Administered 2014-06-30: 3 [IU] via SUBCUTANEOUS

## 2014-06-30 MED ORDER — INSULIN ASPART 100 UNIT/ML ~~LOC~~ SOLN
0.0000 [IU] | Freq: Three times a day (TID) | SUBCUTANEOUS | Status: DC
  Administered 2014-06-30: 4 [IU] via SUBCUTANEOUS
  Administered 2014-07-01 (×3): 3 [IU] via SUBCUTANEOUS

## 2014-06-30 NOTE — Progress Notes (Signed)
Patient ID: Jill Chavez, female   DOB: 1950/07/28, 64 y.o.   MRN: [id number] Patient doing well with left frontal headache  Awake alert oriented strength out of 5  MRI with a large right frontal enhancing partially hemorrhagic mass multiple up to 5 other lesions scattered throughout her brain. I have recommended a bicoronal stereotactic craniotomy for removal of the large right frontal lesion. I've extensively reviewed the risks and benefits of the operation with the patient and her husband as well as perioperative course expectations of outcome and alternatives of surgery and she understands and agrees to proceed forward will tend was set this up for Wednesday afternoon.

## 2014-06-30 NOTE — Progress Notes (Signed)
UR COMPLETED  

## 2014-06-30 NOTE — Progress Notes (Signed)
Pt transferred to 4N by this RN at 1545 via wheelchair. Husband at side. VSS. All belongings sent with patient. CCMD and eICU notified of transfer. Pt placed in bed with alarm on. SCD on.

## 2014-06-30 NOTE — Progress Notes (Signed)
Pt arrived to 4N14 via wheelchair.  Welcomed and settled into the room.  VSS.  Call bell within reach, husband at bedside.  Will continue to monitor.  Cori Razor, RN

## 2014-06-30 NOTE — Care Management Note (Unsigned)
    Page 1 of 1   06/30/2014     10:53:26 AM CARE MANAGEMENT NOTE 06/30/2014  Patient:  Jill Chavez, Jill Chavez   Account Number:  [id number]  Date Initiated:  06/30/2014  Documentation initiated by:  Whitman Hero  Subjective/Objective Assessment:   From home with husband/family admitted with mutiple brain masses.     Action/Plan:   Return to home when medically stable. CM to f/u with disposition needs.   Anticipated DC Date:     Anticipated DC Plan:        DC Planning Services  CM consult      Choice offered to / List presented to:             Status of service:  In process, will continue to follow Medicare Important Message given?  NO (If response is "NO", the following Medicare IM given date fields will be blank) Date Medicare IM given:   Medicare IM given by:   Date Additional Medicare IM given:   Additional Medicare IM given by:    Discharge Disposition:    Per UR Regulation:  Reviewed for med. necessity/level of care/duration of stay  If discussed at [place] of Stay Meetings, dates discussed:    Comments:  06/30/2014 @10 :[address] RN,BSN,CM [place] (husband) [phone]

## 2014-06-30 NOTE — Progress Notes (Signed)
[facility] TEAM 1 - Stepdown/ICU TEAM Progress Note  Jill Chavez WNU:[id number] DOB: 05-11-1950 DOA: 06/28/2014 PCP: No PCP Per Patient  Admit HPI / Brief Narrative: 64 yo female overall healthy brought in by husband and daughter with over a week of balance difficulty, falling and acting strange. They noticed she said things randomly which were out of character, and had progressive weakness w/ trouble performing daily activites.  She had fallen several times, and family noticed her gait was off. She had been loosing weight unintentionally. Her last colonoscopy was over 10 years ago, last mammogram years ago. She denied any brbpr, melena, abd pain, cough. No dysphagia. No lumps, bumps anywhere that she had noticed including breast. Nonsmoker. In the ED a CT noted multiple brain masses.   HPI/Subjective: The patient is awake alert and conversant.  She is quite pleasant today.  She denies any complaints whatsoever at present.  The patient's family reports she is much more like her usual self at this time.  She denies headache fevers chills nausea or vomiting.  She reports a very good appetite.  She is not aware of any dark moles and has not noticed any significant changes in the size shape or pigment of any of her known moles.  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 Four distinct brain lesions appreciated per CT head (18 mm right temporal lobe/indistinct left temporal lobe/9 mm upper right cerebellum/34 mm right frontal lobe) - CT chest and abdomen without evidence of a primary malignancy or other metastatic disease - MRI of head identified 6 enhancing brain masses all located near the surface of the brain - radiology suggested top elements of differential would include melanoma, choriocarcinoma, high-grade glioma, CNS  lymphoma - detailed evaluation of the skin of bilateral lower extremities and bilateral upper extremities does not reveal any suspicious looking moles - I have explained to her that we will continue to do serial skin exams when she has less visitors present - ultimately in that no others suspicious lesions have been identified we will be dependent upon the pathology specimen from her planned neurosurgery this week to make a diagnosis after which we will consult Oncology  Hypothyroidism Continue home Synthroid dose  Hyperglycemia due to high-dose steroids Continue sliding scale and adjust dosing as required  Code Status: FULL Family Communication: Spoke with 3 of her sisters and her brother at the bedside  Disposition Plan: Stable for transfer to neurology floor - PT/OT to work with patient - awaiting neurosurgery planned for later this week - consult Oncology when tissue diagnosis available  Consultants: Neurosurgery  Procedures: None  Antibiotics: None  DVT prophylaxis: SCDs  Objective: Blood pressure 130/74, pulse 74, temperature 98.5 F (36.9 C), temperature source Oral, resp. rate 20, height 5\' 5"  (1.651 m), weight 76.7 kg (169 lb 1.5 oz), SpO2 94 %.  Intake/Output Summary (Last 24 hours) at 06/30/14 1052 Last data filed at 06/30/14 0957  Gross per 24 hour  Intake    580 ml  Output    300 ml  Net    280 ml   Exam: General: No acute respiratory distress - alert and oriented Lungs: Clear to auscultation bilaterally without wheezes or crackles Cardiovascular: Regular rate and rhythm without murmur gallop or rub normal S1 and S2 Abdomen: Nontender, nondistended, soft, bowel sounds positive, no rebound, no  ascites, no appreciable mass Extremities: No significant cyanosis, clubbing, edema bilateral lower extremities  Data Reviewed: Basic Metabolic Panel:  Recent Labs Lab 06/28/14 2157 06/29/14 0712 06/30/14 0235  NA 143 137 140  K 3.9 3.8 3.8  CL 108 102 106  CO2 26  24 20   GLUCOSE 115* 141* 164*  BUN 16 12 14   CREATININE 0.61 0.58 0.83  CALCIUM 10.8* 10.5 10.9*    Liver Function Tests:  Recent Labs Lab 06/30/14 0235  AST 19  ALT 12  ALKPHOS 63  BILITOT 0.8  PROT 7.3  ALBUMIN 4.0   Coags:  Recent Labs Lab 06/30/14 0235  INR 1.14    Recent Labs Lab 06/30/14 0235  APTT 28    CBC:  Recent Labs Lab 06/28/14 2157 06/29/14 0712 06/30/14 0235  WBC 8.4 12.1* 17.8*  NEUTROABS 6.4  --   --   HGB 12.7 12.9 13.2  HCT 39.0 40.7 41.2  MCV 89.2 89.1 88.0  PLT 263 260 298    Cardiac Enzymes:  Recent Labs Lab 06/28/14 2157  TROPONINI <0.03    CBG:  Recent Labs Lab 06/29/14 1633 06/29/14 2121 06/30/14 0829  GLUCAP 293* 323* 286*    Recent Results (from the past 240 hour(s))  MRSA PCR Screening     Status: None   Collection Time: 06/29/14  2:43 AM  Result Value Ref Range Status   MRSA by PCR NEGATIVE NEGATIVE Final    Comment:        The GeneXpert MRSA Assay (FDA approved for NASAL specimens only), is one component of a comprehensive MRSA colonization surveillance program. It is not intended to diagnose MRSA infection nor to guide or monitor treatment for MRSA infections.      Studies:   Recent x-ray studies have been reviewed in detail by the Attending Physician  Scheduled Meds:  Scheduled Meds: . dexamethasone  10 mg Intravenous 4 times per day  . insulin aspart  0-5 Units Subcutaneous QHS  . insulin aspart  0-9 Units Subcutaneous TID WC  . levothyroxine  125 mcg Oral QAC breakfast  . pantoprazole  40 mg Oral BID    Time spent on care of this patient: 35 mins   Epifanio Labrador T , MD   [facility] Office  [phone] Pager - Text Page per Shea Evans as per below:  On-Call/Text Page:      [email or web address]      password [email or web address]  If 7PM-7AM, please contact night-coverage [email or web address] Password [email or web address] 06/30/2014, 10:52 AM   LOS: 2 days

## 2014-07-01 DIAGNOSIS — E039 Hypothyroidism, unspecified: Secondary | ICD-10-CM

## 2014-07-01 DIAGNOSIS — I517 Cardiomegaly: Secondary | ICD-10-CM

## 2014-07-01 DIAGNOSIS — R7309 Other abnormal glucose: Secondary | ICD-10-CM

## 2014-07-01 LAB — GLUCOSE, CAPILLARY
GLUCOSE-CAPILLARY: 139 mg/dL — AB (ref 70–99)
GLUCOSE-CAPILLARY: 148 mg/dL — AB (ref 70–99)
GLUCOSE-CAPILLARY: 186 mg/dL — AB (ref 70–99)
Glucose-Capillary: 178 mg/dL — ABNORMAL HIGH (ref 70–99)

## 2014-07-01 MED ORDER — ONDANSETRON HCL 4 MG/2ML IJ SOLN: 4.0000 mg | Freq: Four times a day (QID) | INTRAMUSCULAR | Status: DC | PRN

## 2014-07-01 MED ORDER — ACETAMINOPHEN 325 MG PO TABS
650.0000 mg | ORAL_TABLET | Freq: Four times a day (QID) | ORAL | Status: DC | PRN
  Administered 2014-07-01: 325 mg via ORAL
  Filled 2014-07-01: qty 2

## 2014-07-01 NOTE — Evaluation (Signed)
Physical Therapy Evaluation Patient Details Name: Jill Chavez MRN: [id number] DOB: [birth year] Today's Date: 07/01/2014   History of Present Illness  Patient is 64 year old female admitted for progressive difficulty with walking and balance with L sided weakness. Pt found to have bilateral frontoparietal small lesions and 1 large R frontal lession. Per Neurosurgery pt planed for craniotomy for excision of the R frontal lesion Wednesday afternoon.  Clinical Impression  Pt demonstrating L sided weakness, balance impairment, and mild cognitive deficits all noted below. Pt planned for craniotomy 4/20 PM. PT to reassess pt post surgery when cleared by MD to re-assess function and d/c planning.    Follow Up Recommendations Supervision/Assistance - 24 hour (will determine s/p surgery)    Equipment Recommendations   (will determine s/p surgery)    Recommendations for Other Services       Precautions / Restrictions Precautions Precautions: Fall Restrictions Weight Bearing Restrictions: No      Mobility  Bed Mobility Overal bed mobility: Modified Independent             General bed mobility comments: used bed rail, HOB elevated  Transfers Overall transfer level: Needs assistance Equipment used: None Transfers: Sit to/from Stand Sit to Stand: Supervision         General transfer comment: supervision for safety due to slighly impulsivity and quick movement  Ambulation/Gait Ambulation/Gait assistance: Min guard Ambulation Distance (Feet): 500 Feet Assistive device: None Gait Pattern/deviations: Decreased step length - left;Decreased stance time - left;Decreased stride length;Narrow base of support     General Gait Details: pt with decreased L step height, mild lateral sway. pt mildly unsteady but no episodes of LOB. Pt with narrow base of support with near L LE cross over gait pattern. Pt unable to consisitently follow 2 step directional commands. 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 pt demo'd decreased stability with challenged balance             Pertinent Vitals/Pain Pain Assessment: No/denies pain    Home Living Family/patient expects to be discharged to:: Private residence Living Arrangements: Spouse/significant other Available Help at Discharge: Family;Available 24 hours/day Type of Home: House Home Access: Stairs to enter Entrance Stairs-Rails: Can reach both Entrance Stairs-Number of Steps: 4 Home Layout: One level Home Equipment: None      Prior Function Level of Independence: Independent               Hand Dominance   Dominant Hand: Right    Extremity/Trunk Assessment   Upper Extremity Assessment: LUE deficits/detail       LUE Deficits / Details: grossly 4/5   Lower Extremity Assessment: LLE deficits/detail   LLE Deficits / Details: grossly 4/5  Cervical / Trunk Assessment: Normal  Communication   Communication: No difficulties  Cognition Arousal/Alertness: Awake/alert Behavior During Therapy: WFL for tasks assessed/performed (slightly impulsive) Overall Cognitive Status: Impaired/Different from baseline Area of Impairment: Attention;Safety/judgement;Awareness;Problem solving;Following commands;Memory   Current Attention Level: Selective Memory: Decreased short-term memory Following Commands: Follows multi-step commands inconsistently;Follows one step  commands consistently Safety/Judgement: Decreased awareness of safety;Decreased awareness of deficits Awareness: Emergent Problem Solving: Slow processing General Comments: pt unable to to follow 2 step directional commands (ie. turn R then L) due to  inattention and impaired short term memory    General Comments      Exercises        Assessment/Plan    PT Assessment Patient needs continued PT services  PT Diagnosis Difficulty walking   PT Problem List Decreased strength;Decreased activity tolerance;Decreased balance;Decreased mobility;Decreased safety awareness  PT Treatment Interventions Gait training;Stair training;Functional mobility training;Therapeutic activities;Therapeutic exercise;Balance training   PT Goals (Current goals can be found in the Care Plan section) Acute Rehab PT Goals PT Goal Formulation: With patient/family Time For Goal Achievement: 07/15/14 Potential to Achieve Goals: Good Additional Goals Additional Goal #1: Pt will score >19 on DGI indicating minimal falls risk.    Frequency Min 4X/week   Barriers to discharge        Co-evaluation               End of Session Equipment Utilized During Treatment: Gait belt Activity Tolerance: Patient tolerated treatment well Patient left: in chair;with chair alarm set;with family/visitor present Nurse Communication: Mobility status         Time: 7096-2836 PT Time Calculation (min) (ACUTE ONLY): 20 min   Charges:   PT Evaluation $Initial PT Evaluation Tier I: 1 Procedure     PT G CodesKingsley Callander 07/01/2014, 10:17 AM   Kittie Plater, PT, DPT Pager #: [phone] Office #: [phone]

## 2014-07-01 NOTE — Progress Notes (Signed)
PATIENT DETAILS Name: Jill Chavez Age: 64 y.o. Sex: female Date of Birth: 04/05/1950 Admit Date: 06/28/2014 Admitting Physician Phillips Grout, MD PCP:No PCP Per Patient  Brief Narrative: 64 year old female with history of hypothyroidism, admitted for evaluation of balance difficulty, acting strange and falling. CT of the head noted multiple brain masses with a acute hemorrhage and a 34 mm mass in the right frontal lobe. Subsequent MRI of the brain identified 6 enhancing brain masses. Patient was admitted to a stepdown unit, started on IV Decadron. Neurosurgery was consulted, current plans are for stereotactic craniotomy on 4/20 for removal of the large right frontal lesion. CT of the abdomen and chest did not reveal any primary malignancy. Plans are for ultimate oncology evaluation once further information with the biopsy is available.  Subjective: Mild headache, otherwise no major events overnight. Son at bedside  Assessment/Plan: Principal Problem:   Multiple brain masses including a 57mm Right frontal lobe brain mass with edema and hemorrhage/hematoma: Presented with ataxia history of falls, initial CT of the head noted multiple brain masses with a acute hemorrhage and a 34 mm mass in the right frontal lobe. Subsequent MRI of the brain identified 6 enhancing brain masses. Started on Decadron, seen in consultation by neurosurgery, plans are for stereotactic craniotomy on 4/20. Most likely etiology to be malignant-see discussion below.    Suspected brain malignancy-primary CNS neoplasm vs mets with a  unknown primary:Four distinct brain lesions appreciated per CT head (18 mm right temporal lobe/indistinct left temporal lobe/9 mm upper right cerebellum/34 mm right frontal lobe) - CT chest and abdomen without evidence of a primary malignancy or other metastatic disease - MRI of head identified 6 enhancing brain masses all located near the surface of the brain - radiology  suggested top elements of differential would include melanoma, choriocarcinoma, high-grade glioma, CNS lymphoma. On exam, does not have any suspicious looking moles on her skin. Per patient, her last colonoscopy, mammography and Pap smear was more than 10 years back. Since no foci of primary malignancy evident on exam or on radiologic studies, suspect diagnosis would be dependent on pathology specimen from craniotomy.    Hypothyroidism: Continue Synthroid    Mild hyperglycemia: Suspect secondary to the Decadron, continue SSI and follow CBGs.  Disposition: Remain inpatient-Disposition will be dependent post craniotiomy evaluation  Antimicrobial agents  See below  Anti-infectives    None      DVT Prophylaxis:  SCD's  Code Status: Full code   Family Communication Son at bedside  Procedures: None  CONSULTS:  Neurosurgery  MEDICATIONS: Scheduled Meds: . dexamethasone  10 mg Intravenous 4 times per day  . insulin aspart  0-20 Units Subcutaneous TID WC  . insulin aspart  0-5 Units Subcutaneous QHS  . levothyroxine  125 mcg Oral QAC breakfast  . pantoprazole  40 mg Oral BID   Continuous Infusions:  PRN Meds:.acetaminophen, ondansetron (ZOFRAN) IV    PHYSICAL EXAM: Vital signs in last 24 hours: Filed Vitals:   06/30/14 2114 07/01/14 0159 07/01/14 0626 07/01/14 0952  BP: 148/85 148/77 149/96 159/78  Pulse: 73 63 68 62  Temp: 98.6 F (37 C) 98.5 F (36.9 C) 98.8 F (37.1 C) 98.7 F (37.1 C)  TempSrc: Oral Oral Oral Oral  Resp: 18 18 20 20   Height:      Weight:      SpO2: 95% 93% 93% 96%    Weight change:  [employer]  Weights   06/28/14 2122 06/29/14 0247 06/30/14 0324  Weight: 76.204 kg (168 lb) 76.7 kg (169 lb 1.5 oz) 76.7 kg (169 lb 1.5 oz)   Body mass index is 28.14 kg/(m^2).   Gen Exam: Awake and alert with clear speech. Neck: Supple, No JVD.   Chest: B/L Clear.   CVS: S1 S2 Regular, no murmurs.  Abdomen: soft, BS +, non tender, non distended.    Extremities: no edema, lower extremities warm to touch. Neurologic: Non Focal.   Skin: No Rash.   Wounds: N/A.    Intake/Output from previous day:  Intake/Output Summary (Last 24 hours) at 07/01/14 1101 Last data filed at 07/01/14 0700  Gross per 24 hour  Intake    600 ml  Output      0 ml  Net    600 ml     LAB RESULTS: CBC  Recent Labs Lab 06/28/14 2157 06/29/14 0712 06/30/14 0235  WBC 8.4 12.1* 17.8*  HGB 12.7 12.9 13.2  HCT 39.0 40.7 41.2  PLT 263 260 298  MCV 89.2 89.1 88.0  MCH 29.1 28.2 28.2  MCHC 32.6 31.7 32.0  RDW 13.6 13.7 13.8  LYMPHSABS 1.4  --   --   MONOABS 0.6  --   --   EOSABS 0.0  --   --   BASOSABS 0.0  --   --     Chemistries   Recent Labs Lab 06/28/14 2157 06/29/14 0712 06/30/14 0235  NA 143 137 140  K 3.9 3.8 3.8  CL 108 102 106  CO2 26 24 20   GLUCOSE 115* 141* 164*  BUN 16 12 14   CREATININE 0.61 0.58 0.83  CALCIUM 10.8* 10.5 10.9*    CBG:  Recent Labs Lab 06/30/14 0829 06/30/14 1333 06/30/14 1714 06/30/14 2125 07/01/14 0629  GLUCAP 286* 147* 164* 226* 139*    GFR Estimated Creatinine Clearance: 71.1 mL/min (by C-G formula based on Cr of 0.83).  Coagulation profile  Recent Labs Lab 06/30/14 0235  INR 1.14    Cardiac Enzymes  Recent Labs Lab 06/28/14 2157  TROPONINI <0.03    Invalid input(s): POCBNP No results for input(s): DDIMER in the last 72 hours. No results for input(s): HGBA1C in the last 72 hours. No results for input(s): CHOL, HDL, LDLCALC, TRIG, CHOLHDL, LDLDIRECT in the last 72 hours. No results for input(s): TSH, T4TOTAL, T3FREE, THYROIDAB in the last 72 hours.  Invalid input(s): FREET3 No results for input(s): VITAMINB12, FOLATE, FERRITIN, TIBC, IRON, RETICCTPCT in the last 72 hours. No results for input(s): LIPASE, AMYLASE in the last 72 hours.  Urine Studies No results for input(s): UHGB, CRYS in the last 72 hours.  Invalid input(s): UACOL, UAPR, USPG, UPH, UTP, UGL, UKET, UBIL,  UNIT, UROB, ULEU, UEPI, UWBC, URBC, UBAC, CAST, UCOM, BILUA  MICROBIOLOGY: Recent Results (from the past 240 hour(s))  MRSA PCR Screening     Status: None   Collection Time: 06/29/14  2:43 AM  Result Value Ref Range Status   MRSA by PCR NEGATIVE NEGATIVE Final    Comment:        The GeneXpert MRSA Assay (FDA approved for NASAL specimens only), is one component of a comprehensive MRSA colonization surveillance program. It is not intended to diagnose MRSA infection nor to guide or monitor treatment for MRSA infections.     RADIOLOGY STUDIES/RESULTS: Ct Head Wo Contrast  06/28/2014   CLINICAL DATA:  Patient not acting the same. Stuttering since Wednesday.  EXAM: CT HEAD WITHOUT CONTRAST  TECHNIQUE: Contiguous axial images were obtained from the base of the skull through the vertex without intravenous contrast.  COMPARISON:  None.  FINDINGS: Skull and Sinuses:Negative for fracture or destructive process. The mastoids, middle ears, and imaged paranasal sinuses are clear.  Orbits: No acute abnormality.  Brain: There are at least 4 hyper dense intra-axial masses, the largest in the right frontal lobe measuring 34 mm. This mass is complicated by hematoma which extends posteriorly into the right frontal white matter, measuring 28 x 39 x 30 mm. There is also an 18 mm mass in the right temporal lobe, an indistinct mass in the left temporal lobe, and a 9 mm mass in the upper right cerebellum. Patchy vasogenic edema, maximal around the hemorrhagic lesion on the right. There is mass effect and midline shift measuring up to 13 mm. No ACA infarct. The left temporal horn is mildly dilated, but no overt hydrocephalus.  Critical Value/emergent results were called by telephone at the time of interpretation on 06/28/2014 at 10:40 pm to Dr. Tanna Furry , who verbally acknowledged these results.  IMPRESSION: 1. At least 4 brain masses, a metastatic pattern. A 34 mm mass in the right frontal lobe has undergone acute  hemorrhage with a 16 cc volume hematoma. 2. Patchy vasogenic edema from #1, maximal in the right frontal lobe with subfalcine herniation and developing left lateral ventricle entrapment. 3. 13 mm calcified left parafalcine meningioma.   Electronically Signed   By: Monte Fantasia M.D.   On: 06/28/2014 22:42   Ct Chest W Contrast  06/29/2014   CLINICAL DATA:  Brain mass.  EXAM: CT CHEST, ABDOMEN, AND PELVIS WITH CONTRAST  TECHNIQUE: Multidetector CT imaging of the chest, abdomen and pelvis was performed following the standard protocol during bolus administration of intravenous contrast.  CONTRAST:  136mL OMNIPAQUE IOHEXOL 300 MG/ML  SOLN  COMPARISON:  None.  FINDINGS: CT CHEST FINDINGS  THORACIC INLET/BODY WALL:  No acute abnormality.  MEDIASTINUM:  Mild cardiomegaly. There is fusiform aneurysmal enlargement of the ascending aorta to 41 mm. No acute vascular findings. No lymphadenopathy. Negative esophagus.  LUNG WINDOWS:  No suspicious pulmonary nodule.  OSSEOUS:  No suspicious lytic or blastic lesions.  CT ABDOMEN AND PELVIS FINDINGS  BODY WALL: No contributory findings.  Liver: No focal abnormality.  Biliary: No evidence of biliary obstruction or stone.  Pancreas: Unremarkable.  Spleen: Unremarkable.  Adrenals: Unremarkable.  Kidneys and ureters: No evidence of solid renal mass. There is a 42 mm cyst in the lower pole left kidney. Right renal cortical low densities are too small to characterize but favor cyst. No hydronephrosis.  Bladder: Unremarkable.  Reproductive: No pathologic findings.  Bowel: Negative.  Retroperitoneum: No mass or adenopathy.  Peritoneum: No ascites or pneumoperitoneum.  Vascular: No acute abnormality.  OSSEOUS:  No suspicious lytic or blastic lesions.  IMPRESSION: 1. No evidence of primary malignancy or metastatic disease in the chest or abdomen. 2. 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   Electronically Signed   By: Monte Fantasia M.D.   On: 06/29/2014 05:10     Mr [name] PO Contrast  06/29/2014   CLINICAL DATA:  64 year old female with multiple brain masses discovered on head CT for altered mental status. No primary malignancy identified recently by CT in the chest abdomen or pelvis. Initial encounter.  EXAM: MRI HEAD WITHOUT AND WITH CONTRAST  TECHNIQUE: Multiplanar, multiecho pulse sequences of the brain and surrounding structures were obtained without and with intravenous  contrast.  CONTRAST:  30mL MULTIHANCE GADOBENATE DIMEGLUMINE 529 MG/ML IV SOLN  COMPARISON:  Head CT without contrast 06/28/2014.  FINDINGS: Motion artifact on post-contrast images.  Six enhancing brain masses are identified. All appear to be intra-axial, but all are located near the surface of the brain. The largest masses in the anterior right frontal lobe and has hemorrhaged. The blood products encompass 54 x 51 x 32 mm. The lobulated enhancing component of the mass is 45 x 32 x 41 mm. The most solid enhancing component of the mass is isointense to gray matter on T2 with diffusion restriction. Moderate to severe surrounding vasogenic edema which tracks into the genu of the corpus callosum an just across midline, without associated enhancement. Significant regional mass effect with regional midline shift up to 14 mm.  The other 5 enhancing metastases measure up to 24 mm. Of these, a lesion at the left superior frontal gyrus abutting the Fox and a lesion in the right superior cerebellum show definite internal hemorrhage, but no surrounding edema. The largest of these is located near the right sylvian fissure in the temporal stem and has both solid and cystic components.  Also, all but the left superior parasagittal lesion demonstrate intrinsic T1 hyperintensity, which for 2 lesions does not seem related to blood products.  No other blood products are identified within the brain. Mass effect on the lateral and third ventricles without ventriculomegaly. Basilar cisterns remain patent. Partially  empty sella configuration. No extra-axial fluid or hemorrhage. No restricted diffusion or evidence of acute infarction. Major intracranial vascular flow voids are preserved. Mass effect on the bilateral ACAs.  Normal visualized bone marrow signal. Grossly normal visualized cervical spinal cord.  Degenerative disc herniation at C3-C4 resulting in mild spinal stenosis. Visualized paranasal sinuses and mastoids are clear. Visualized orbit soft tissues are within normal limits. Visualized scalp soft tissues are within normal limits.  IMPRESSION: 1. Six enhancing brain masses are identified. All appearing intra-axial, but are located near the surface of the brain. The largest in the anterior right frontal lobe has hemorrhaged into the brain. Two other small lesions have bled internally without surrounding edema. Two lesions have intrinsic T1 hyperintensity which appears unrelated to blood products. 2. Top differential considerations in light of no primary tumor identified on CT chest abdomen and pelvis include: - Melanoma metastases (favored), - Other hypervascular metastasis such as thyroid (I note the thyroid is absent) or choriocarcinoma, - Multifocal high-grade glioma - CNS lymphoma (least likely). 3. Moderate to severe regional mass effect in the anterior frontal lobes, with midline shift up to 14 mm. Basilar cisterns remain patent.   Electronically Signed   By: Genevie Ann M.D.   On: 06/29/2014 12:09   Ct Abdomen Pelvis W Contrast  06/29/2014   CLINICAL DATA:  Brain mass.  EXAM: CT CHEST, ABDOMEN, AND PELVIS WITH CONTRAST  TECHNIQUE: Multidetector CT imaging of the chest, abdomen and pelvis was performed following the standard protocol during bolus administration of intravenous contrast.  CONTRAST:  153mL OMNIPAQUE IOHEXOL 300 MG/ML  SOLN  COMPARISON:  None.  FINDINGS: CT CHEST FINDINGS  THORACIC INLET/BODY WALL:  No acute abnormality.  MEDIASTINUM:  Mild cardiomegaly. There is fusiform aneurysmal enlargement of the  ascending aorta to 41 mm. No acute vascular findings. No lymphadenopathy. Negative esophagus.  LUNG WINDOWS:  No suspicious pulmonary nodule.  OSSEOUS:  No suspicious lytic or blastic lesions.  CT ABDOMEN AND PELVIS FINDINGS  BODY WALL: No contributory findings.  Liver: No focal abnormality.  Biliary: No evidence of biliary obstruction or stone.  Pancreas: Unremarkable.  Spleen: Unremarkable.  Adrenals: Unremarkable.  Kidneys and ureters: No evidence of solid renal mass. There is a 42 mm cyst in the lower pole left kidney. Right renal cortical low densities are too small to characterize but favor cyst. No hydronephrosis.  Bladder: Unremarkable.  Reproductive: No pathologic findings.  Bowel: Negative.  Retroperitoneum: No mass or adenopathy.  Peritoneum: No ascites or pneumoperitoneum.  Vascular: No acute abnormality.  OSSEOUS:  No suspicious lytic or blastic lesions.  IMPRESSION: 1. No evidence of primary malignancy or metastatic disease in the chest or abdomen. 2. 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   Electronically Signed   By: Monte Fantasia M.D.   On: 06/29/2014 05:10    Oren Binet, MD  [facility] Pager:[phone]  If 7PM-7AM, please contact night-coverage [email or web address] Password [email or web address] 07/01/2014, 11:01 AM   LOS: 3 days

## 2014-07-01 NOTE — Progress Notes (Signed)
OT NOTE  OT to hold evaluation at this time now that Neurosurgery has scheduled surgery for Wednesday 07/02/14. Pt currently without fine motor or vision deficits per notes by MD or RN to evaluate at this time. OT to defer gross motor evaluation to PT Ashly (discussed at length). Pt observed managing breakfast tray without (A) required. OT to await second order s/p surgery as appropriate.   Jeri Modena   OTR/L Pager: [phone] Office: [phone] .

## 2014-07-01 NOTE — Progress Notes (Signed)
  Echocardiogram 2D Echocardiogram has been performed.  Hennesy Sobalvarro 07/01/2014, 11:22 AM

## 2014-07-02 ENCOUNTER — Inpatient Hospital Stay (HOSPITAL_COMMUNITY): Payer: 59 | Admitting: Certified Registered Nurse Anesthetist

## 2014-07-02 ENCOUNTER — Inpatient Hospital Stay (HOSPITAL_COMMUNITY): Payer: 59

## 2014-07-02 ENCOUNTER — Encounter (HOSPITAL_COMMUNITY)
Admission: EM | Disposition: A | Discharge status: Home Health | Payer: Self-pay | Source: Home / Self Care | Attending: Internal Medicine

## 2014-07-02 ENCOUNTER — Encounter (HOSPITAL_COMMUNITY): Payer: Self-pay | Admitting: Certified Registered Nurse Anesthetist

## 2014-07-02 DIAGNOSIS — D496 Neoplasm of unspecified behavior of brain: Secondary | ICD-10-CM | POA: Diagnosis present

## 2014-07-02 HISTORY — PX: CRANIOTOMY: SHX93

## 2014-07-02 LAB — GLUCOSE, CAPILLARY
GLUCOSE-CAPILLARY: 131 mg/dL — AB (ref 70–99)
Glucose-Capillary: 126 mg/dL — ABNORMAL HIGH (ref 70–99)
Glucose-Capillary: 125 mg/dL — ABNORMAL HIGH (ref 70–99)

## 2014-07-02 LAB — TYPE AND SCREEN
ABO/RH(D): O POS
ANTIBODY SCREEN: NEGATIVE

## 2014-07-02 LAB — ABO/RH: ABO/RH(D): O POS

## 2014-07-02 SURGERY — CRANIOTOMY TUMOR EXCISION
Anesthesia: General | Site: Head | Laterality: Right

## 2014-07-02 MED ORDER — EPHEDRINE SULFATE 50 MG/ML IJ SOLN
INTRAMUSCULAR | Status: DC | PRN
  Administered 2014-07-02: 5 mg via INTRAVENOUS

## 2014-07-02 MED ORDER — ACETAMINOPHEN 325 MG PO TABS
650.0000 mg | ORAL_TABLET | ORAL | Status: DC | PRN
  Administered 2014-07-04: 650 mg via ORAL
  Filled 2014-07-02: qty 2

## 2014-07-02 MED ORDER — ACETAMINOPHEN 650 MG RE SUPP: 650.0000 mg | RECTAL | Status: DC | PRN

## 2014-07-02 MED ORDER — ONDANSETRON HCL 4 MG/2ML IJ SOLN
4.0000 mg | INTRAMUSCULAR | Status: DC | PRN
  Administered 2014-07-05: 4 mg via INTRAVENOUS

## 2014-07-02 MED ORDER — NEOSTIGMINE METHYLSULFATE 10 MG/10ML IV SOLN
INTRAVENOUS | Status: DC | PRN
  Administered 2014-07-02: 3 mg via INTRAVENOUS

## 2014-07-02 MED ORDER — FENTANYL CITRATE (PF) 250 MCG/5ML IJ SOLN
INTRAMUSCULAR | Status: AC
  Filled 2014-07-02: qty 5

## 2014-07-02 MED ORDER — 0.9 % SODIUM CHLORIDE (POUR BTL) OPTIME
TOPICAL | Status: DC | PRN
  Administered 2014-07-02 (×2): 1000 mL

## 2014-07-02 MED ORDER — SODIUM CHLORIDE 0.9 % IV SOLN
INTRAVENOUS | Status: DC | PRN
  Administered 2014-07-02: 15:00:00 via INTRAVENOUS

## 2014-07-02 MED ORDER — LIDOCAINE-EPINEPHRINE 1 %-1:100000 IJ SOLN
INTRAMUSCULAR | Status: DC | PRN
  Administered 2014-07-02: 9 mL

## 2014-07-02 MED ORDER — DEXAMETHASONE SODIUM PHOSPHATE 10 MG/ML IJ SOLN
6.0000 mg | Freq: Four times a day (QID) | INTRAMUSCULAR | Status: AC
  Administered 2014-07-02 – 2014-07-03 (×4): 6 mg via INTRAVENOUS
  Filled 2014-07-02: qty 0.6
  Filled 2014-07-02 (×2): qty 1
  Filled 2014-07-02: qty 0.6

## 2014-07-02 MED ORDER — PROPOFOL 10 MG/ML IV BOLUS
INTRAVENOUS | Status: AC
  Filled 2014-07-02: qty 20

## 2014-07-02 MED ORDER — ONDANSETRON HCL 4 MG/2ML IJ SOLN
INTRAMUSCULAR | Status: AC
  Administered 2014-07-02: 4 mg
  Filled 2014-07-02: qty 2

## 2014-07-02 MED ORDER — PHENYLEPHRINE HCL 10 MG/ML IJ SOLN
10.0000 mg | INTRAVENOUS | Status: DC | PRN
  Administered 2014-07-02: 15 ug/min via INTRAVENOUS

## 2014-07-02 MED ORDER — DEXAMETHASONE SODIUM PHOSPHATE 4 MG/ML IJ SOLN
4.0000 mg | Freq: Four times a day (QID) | INTRAMUSCULAR | Status: AC
  Administered 2014-07-03 – 2014-07-04 (×4): 4 mg via INTRAVENOUS
  Filled 2014-07-02 (×5): qty 1

## 2014-07-02 MED ORDER — PANTOPRAZOLE SODIUM 40 MG IV SOLR: 40.0000 mg | Freq: Every day | INTRAVENOUS | Status: DC

## 2014-07-02 MED ORDER — BACITRACIN ZINC 500 UNIT/GM EX OINT
TOPICAL_OINTMENT | CUTANEOUS | Status: DC | PRN
  Administered 2014-07-02: 1 via TOPICAL

## 2014-07-02 MED ORDER — POTASSIUM CHLORIDE IN NACL 20-0.9 MEQ/L-% IV SOLN
INTRAVENOUS | Status: DC
  Administered 2014-07-02 – 2014-07-04 (×2): via INTRAVENOUS
  Filled 2014-07-02 (×5): qty 1000

## 2014-07-02 MED ORDER — ONDANSETRON HCL 4 MG PO TABS: 4.0000 mg | ORAL_TABLET | ORAL | Status: DC | PRN

## 2014-07-02 MED ORDER — CEFAZOLIN SODIUM 1-5 GM-% IV SOLN
1.0000 g | Freq: Three times a day (TID) | INTRAVENOUS | Status: AC
  Administered 2014-07-02 – 2014-07-03 (×2): 1 g via INTRAVENOUS
  Filled 2014-07-02 (×2): qty 50

## 2014-07-02 MED ORDER — FENTANYL CITRATE (PF) 100 MCG/2ML IJ SOLN
INTRAMUSCULAR | Status: DC | PRN
  Administered 2014-07-02 (×7): 50 ug via INTRAVENOUS

## 2014-07-02 MED ORDER — MICROFIBRILLAR COLL HEMOSTAT EX PADS
MEDICATED_PAD | CUTANEOUS | Status: DC | PRN
  Administered 2014-07-02: 1 via TOPICAL

## 2014-07-02 MED ORDER — ONDANSETRON HCL 4 MG/2ML IJ SOLN
INTRAMUSCULAR | Status: DC | PRN
  Administered 2014-07-02: 4 mg via INTRAVENOUS

## 2014-07-02 MED ORDER — CEFAZOLIN SODIUM-DEXTROSE 2-3 GM-% IV SOLR
INTRAVENOUS | Status: DC | PRN
  Administered 2014-07-02: 2 g via INTRAVENOUS

## 2014-07-02 MED ORDER — ROCURONIUM BROMIDE 100 MG/10ML IV SOLN
INTRAVENOUS | Status: DC | PRN
  Administered 2014-07-02: 10 mg via INTRAVENOUS
  Administered 2014-07-02: 50 mg via INTRAVENOUS
  Administered 2014-07-02: 10 mg via INTRAVENOUS

## 2014-07-02 MED ORDER — MICROFIBRILLAR COLL HEMOSTAT EX PADS: MEDICATED_PAD | CUTANEOUS | Status: DC | PRN

## 2014-07-02 MED ORDER — HEMOSTATIC AGENTS (NO CHARGE) OPTIME
TOPICAL | Status: DC | PRN
  Administered 2014-07-02 (×2): 1 via TOPICAL

## 2014-07-02 MED ORDER — ONDANSETRON HCL 4 MG/2ML IJ SOLN: 4.0000 mg | Freq: Four times a day (QID) | INTRAMUSCULAR | Status: DC | PRN

## 2014-07-02 MED ORDER — DEXAMETHASONE SODIUM PHOSPHATE 4 MG/ML IJ SOLN
4.0000 mg | Freq: Three times a day (TID) | INTRAMUSCULAR | Status: DC
  Administered 2014-07-05 (×2): 4 mg via INTRAVENOUS
  Filled 2014-07-02 (×5): qty 1

## 2014-07-02 MED ORDER — LABETALOL HCL 5 MG/ML IV SOLN: 10.0000 mg | INTRAVENOUS | Status: DC | PRN

## 2014-07-02 MED ORDER — THROMBIN 5000 UNITS EX SOLR
CUTANEOUS | Status: DC | PRN
  Administered 2014-07-02 (×4): 5000 [IU] via TOPICAL

## 2014-07-02 MED ORDER — PHENYLEPHRINE HCL 10 MG/ML IJ SOLN
INTRAMUSCULAR | Status: DC | PRN
  Administered 2014-07-02: 80 ug via INTRAVENOUS

## 2014-07-02 MED ORDER — PROMETHAZINE HCL 25 MG PO TABS
12.5000 mg | ORAL_TABLET | ORAL | Status: DC | PRN
Start: 2014-07-02 — End: 2014-07-05

## 2014-07-02 MED ORDER — OXYCODONE-ACETAMINOPHEN 5-325 MG PO TABS
1.0000 | ORAL_TABLET | ORAL | Status: DC | PRN
  Administered 2014-07-03: 1 via ORAL
  Filled 2014-07-02: qty 1

## 2014-07-02 MED ORDER — OXYCODONE HCL 5 MG/5ML PO SOLN
5.0000 mg | Freq: Once | ORAL | Status: AC | PRN
Start: 2014-07-02 — End: 2014-07-02

## 2014-07-02 MED ORDER — THROMBIN 5000 UNITS EX SOLR
OROMUCOSAL | Status: DC | PRN
  Administered 2014-07-02: 5 mL via TOPICAL

## 2014-07-02 MED ORDER — BACITRACIN 50000 UNITS IM SOLR
INTRAMUSCULAR | Status: DC | PRN
  Administered 2014-07-02: 15:00:00

## 2014-07-02 MED ORDER — LEVETIRACETAM 500 MG/5ML IV SOLN
500.0000 mg | Freq: Two times a day (BID) | INTRAVENOUS | Status: DC
  Administered 2014-07-02 – 2014-07-04 (×4): 500 mg via INTRAVENOUS
  Filled 2014-07-02 (×5): qty 5

## 2014-07-02 MED ORDER — SODIUM CHLORIDE 0.9 % IV SOLN
INTRAVENOUS | Status: DC | PRN
  Administered 2014-07-02: 16:00:00 via INTRAVENOUS

## 2014-07-02 MED ORDER — PROPOFOL 10 MG/ML IV BOLUS
INTRAVENOUS | Status: DC | PRN
  Administered 2014-07-02: 150 mg via INTRAVENOUS

## 2014-07-02 MED ORDER — OXYCODONE HCL 5 MG PO TABS
5.0000 mg | ORAL_TABLET | Freq: Once | ORAL | Status: AC | PRN
Start: 2014-07-02 — End: 2014-07-02
  Administered 2014-07-02: 5 mg via ORAL
  Filled 2014-07-02: qty 1

## 2014-07-02 MED ORDER — GLYCOPYRROLATE 0.2 MG/ML IJ SOLN
INTRAMUSCULAR | Status: DC | PRN
  Administered 2014-07-02 (×2): 0.4 mg via INTRAVENOUS

## 2014-07-02 MED ORDER — MANNITOL 25 % IV SOLN
INTRAVENOUS | Status: DC | PRN
  Administered 2014-07-02: 25 g via INTRAVENOUS

## 2014-07-02 MED ORDER — FENTANYL CITRATE (PF) 100 MCG/2ML IJ SOLN: 25.0000 ug | INTRAMUSCULAR | Status: DC | PRN

## 2014-07-02 MED ORDER — DEXAMETHASONE SODIUM PHOSPHATE 10 MG/ML IJ SOLN
INTRAMUSCULAR | Status: DC | PRN
  Administered 2014-07-02: 10 mg via INTRAVENOUS

## 2014-07-02 SURGICAL SUPPLY — 96 items
ATTRACTOMAT 16X20 MAGNETIC DRP (DRAPES) ×4 IMPLANT
BAG DECANTER FOR FLEXI CONT (MISCELLANEOUS) ×4 IMPLANT
BANDAGE GAUZE 4  KLING STR (GAUZE/BANDAGES/DRESSINGS) ×4 IMPLANT
BLADE CLIPPER SURG (BLADE) ×4 IMPLANT
BLADE SURG 11 STRL SS (BLADE) ×4 IMPLANT
BNDG COHESIVE 4X5 TAN NS LF (GAUZE/BANDAGES/DRESSINGS) IMPLANT
BNDG GAUZE ELAST 4 BULKY (GAUZE/BANDAGES/DRESSINGS) ×4 IMPLANT
BRUSH SCRUB EZ 1% IODOPHOR (MISCELLANEOUS) IMPLANT
BRUSH SCRUB EZ PLAIN DRY (MISCELLANEOUS) ×4 IMPLANT
BUR ACORN 9.0 PRECISION (BURR) ×3 IMPLANT
BUR ACORN 9.0MM PRECISION (BURR) ×1
BUR ROUTER D-58 CRANI (BURR) ×4 IMPLANT
CANISTER SUCT 3000ML PPV (MISCELLANEOUS) ×8 IMPLANT
CLIP RANEY DISP (INSTRUMENTS) ×4 IMPLANT
CLIP TI MEDIUM 6 (CLIP) IMPLANT
CONT SPEC 4OZ CLIKSEAL STRL BL (MISCELLANEOUS) ×12 IMPLANT
COVER BACK TABLE 60X90IN (DRAPES) IMPLANT
DECANTER SPIKE VIAL GLASS SM (MISCELLANEOUS) ×4 IMPLANT
DRAPE CAMERA VIDEO/LASER (DRAPES) IMPLANT
DRAPE LONG LASER MIC (DRAPES) IMPLANT
DRAPE MICROSCOPE LEICA (MISCELLANEOUS) IMPLANT
DRAPE STERI IOBAN 125X83 (DRAPES) IMPLANT
DRAPE WARM FLUID 44X44 (DRAPE) ×4 IMPLANT
DRSG OPSITE 4X5.5 SM (GAUZE/BANDAGES/DRESSINGS) ×12 IMPLANT
ELECT CAUTERY BLADE 6.4 (BLADE) IMPLANT
ELECT REM PT RETURN 9FT ADLT (ELECTROSURGICAL) ×4
ELECTRODE REM PT RTRN 9FT ADLT (ELECTROSURGICAL) ×2 IMPLANT
GAUZE SPONGE 4X4 12PLY STRL (GAUZE/BANDAGES/DRESSINGS) ×4 IMPLANT
GAUZE SPONGE 4X4 16PLY XRAY LF (GAUZE/BANDAGES/DRESSINGS) ×4 IMPLANT
GLOVE BIO SURGEON STRL SZ 6.5 (GLOVE) ×6 IMPLANT
GLOVE BIO SURGEON STRL SZ7 (GLOVE) IMPLANT
GLOVE BIO SURGEON STRL SZ7.5 (GLOVE) IMPLANT
GLOVE BIO SURGEON STRL SZ8 (GLOVE) ×4 IMPLANT
GLOVE BIO SURGEON STRL SZ8.5 (GLOVE) IMPLANT
GLOVE BIO SURGEONS STRL SZ 6.5 (GLOVE) ×2
GLOVE BIOGEL M 8.0 STRL (GLOVE) IMPLANT
GLOVE BIOGEL PI IND STRL 6.5 (GLOVE) ×2 IMPLANT
GLOVE BIOGEL PI INDICATOR 6.5 (GLOVE) ×2
GLOVE ECLIPSE 6.5 STRL STRAW (GLOVE) ×4 IMPLANT
GLOVE ECLIPSE 7.0 STRL STRAW (GLOVE) IMPLANT
GLOVE ECLIPSE 7.5 STRL STRAW (GLOVE) IMPLANT
GLOVE ECLIPSE 8.0 STRL XLNG CF (GLOVE) IMPLANT
GLOVE ECLIPSE 8.5 STRL (GLOVE) IMPLANT
GLOVE EXAM NITRILE LRG STRL (GLOVE) IMPLANT
GLOVE EXAM NITRILE MD LF STRL (GLOVE) IMPLANT
GLOVE EXAM NITRILE XL STR (GLOVE) IMPLANT
GLOVE EXAM NITRILE XS STR PU (GLOVE) IMPLANT
GLOVE INDICATOR 6.5 STRL GRN (GLOVE) IMPLANT
GLOVE INDICATOR 7.0 STRL GRN (GLOVE) IMPLANT
GLOVE INDICATOR 7.5 STRL GRN (GLOVE) IMPLANT
GLOVE INDICATOR 8.0 STRL GRN (GLOVE) IMPLANT
GLOVE INDICATOR 8.5 STRL (GLOVE) ×4 IMPLANT
GLOVE OPTIFIT SS 8.0 STRL (GLOVE) IMPLANT
GLOVE SURG SS PI 6.5 STRL IVOR (GLOVE) IMPLANT
GOWN STRL REUS W/ TWL LRG LVL3 (GOWN DISPOSABLE) ×4 IMPLANT
GOWN STRL REUS W/ TWL XL LVL3 (GOWN DISPOSABLE) ×2 IMPLANT
GOWN STRL REUS W/TWL 2XL LVL3 (GOWN DISPOSABLE) IMPLANT
GOWN STRL REUS W/TWL LRG LVL3 (GOWN DISPOSABLE) ×4
GOWN STRL REUS W/TWL XL LVL3 (GOWN DISPOSABLE) ×2
HEMOSTAT SURGICEL 2X14 (HEMOSTASIS) ×4 IMPLANT
HOOK DURA (MISCELLANEOUS) ×4 IMPLANT
KIT BASIN OR (CUSTOM PROCEDURE TRAY) ×4 IMPLANT
KIT ROOM TURNOVER OR (KITS) ×4 IMPLANT
LIQUID BAND (GAUZE/BANDAGES/DRESSINGS) IMPLANT
MARKER SPHERE PSV REFLC 13MM (MARKER) ×8 IMPLANT
NEEDLE HYPO 25X1 1.5 SAFETY (NEEDLE) ×4 IMPLANT
NS IRRIG 1000ML POUR BTL (IV SOLUTION) ×8 IMPLANT
PACK CRANIOTOMY (CUSTOM PROCEDURE TRAY) IMPLANT
PAD ARMBOARD 7.5X6 YLW CONV (MISCELLANEOUS) ×12 IMPLANT
PAD EYE OVAL STERILE LF (GAUZE/BANDAGES/DRESSINGS) IMPLANT
PATTIES SURGICAL .25X.25 (GAUZE/BANDAGES/DRESSINGS) IMPLANT
PATTIES SURGICAL .5 X.5 (GAUZE/BANDAGES/DRESSINGS) ×4 IMPLANT
PATTIES SURGICAL .5 X3 (DISPOSABLE) ×12 IMPLANT
PATTIES SURGICAL 1X1 (DISPOSABLE) IMPLANT
PLATE 1.5  2HOLE LNG NEURO (Plate) ×2 IMPLANT
PLATE 1.5 2HOLE LNG NEURO (Plate) ×2 IMPLANT
PLATE 1.5/0.5 18.5MM BURR HOLE (Plate) ×8 IMPLANT
RUBBERBAND STERILE (MISCELLANEOUS) IMPLANT
SCREW SELF DRILL HT 1.5/4MM (Screw) ×40 IMPLANT
SPONGE NEURO XRAY DETECT 1X3 (DISPOSABLE) ×4 IMPLANT
SPONGE SURGIFOAM ABS GEL 100 (HEMOSTASIS) ×8 IMPLANT
SPONGE SURGIFOAM ABS GEL 12-7 (HEMOSTASIS) IMPLANT
STAPLER VISISTAT 35W (STAPLE) ×4 IMPLANT
SUT ETHILON 3 0 FSL (SUTURE) ×4 IMPLANT
SUT NURALON 4 0 TR CR/8 (SUTURE) ×8 IMPLANT
SUT VIC AB 2-0 CT1 18 (SUTURE) ×20 IMPLANT
SYR BULB IRRIGATION 50ML (SYRINGE) ×4 IMPLANT
SYR CONTROL 10ML LL (SYRINGE) ×4 IMPLANT
TIP SONASTAR PREC MISONIX 1.1 (TRAY / TRAY PROCEDURE) IMPLANT
TOWEL OR 17X24 6PK STRL BLUE (TOWEL DISPOSABLE) ×4 IMPLANT
TOWEL OR 17X26 10 PK STRL BLUE (TOWEL DISPOSABLE) ×4 IMPLANT
TRAY FOLEY CATH 14FRSI W/METER (CATHETERS) ×4 IMPLANT
TUBE CONNECTING 12'X1/4 (SUCTIONS) ×1
TUBE CONNECTING 12X1/4 (SUCTIONS) ×3 IMPLANT
UNDERPAD 30X30 INCONTINENT (UNDERPADS AND DIAPERS) IMPLANT
WATER STERILE IRR 1000ML POUR (IV SOLUTION) ×4 IMPLANT

## 2014-07-02 NOTE — Transfer of Care (Signed)
Immediate Anesthesia Transfer of Care Note  Patient: Jill Chavez  Procedure(s) Performed: Procedure(s) with comments: Stereotactic Craniotomy for right frontal brain tumor excision w/BrainLab (Right) - right  APPLICATION OF CRANIAL NAVIGATION (N/A)  Patient Location: PACU  Anesthesia Type:General  Level of Consciousness: awake, alert , oriented and patient cooperative  Airway & Oxygen Therapy: Patient Spontanous Breathing and Patient connected to nasal cannula oxygen  Post-op Assessment: Report given to RN, Post -op Vital signs reviewed and stable, Patient moving all extremities, Patient moving all extremities X 4 and Patient able to stick tongue midline  Post vital signs: Reviewed and stable  Last Vitals:  Filed Vitals:   07/02/14 1341  BP: 151/89  Pulse: 54  Temp: 37.1 C  Resp: 18    Complications: No apparent anesthesia complications

## 2014-07-02 NOTE — Anesthesia Preprocedure Evaluation (Signed)
Anesthesia Evaluation  Patient identified by MRN, date of birth, ID band Patient awake    Reviewed: Allergy & Precautions, NPO status , Patient's Chart, lab work & pertinent test results  Airway Mallampati: II   Neck ROM: full    Dental   Pulmonary neg pulmonary ROS,  breath sounds clear to auscultation        Cardiovascular negative cardio ROS  Rhythm:regular Rate:Normal     Neuro/Psych Brain tumor     GI/Hepatic GERD-  ,  Endo/Other  Hypothyroidism   Renal/GU      Musculoskeletal   Abdominal   Peds  Hematology   Anesthesia Other Findings   Reproductive/Obstetrics                             Anesthesia Physical Anesthesia Plan  ASA: II  Anesthesia Plan: General   Post-op Pain Management:    Induction: Intravenous  Airway Management Planned: Oral ETT  Additional Equipment: Arterial line and CVP  Intra-op Plan:   Post-operative Plan: Extubation in OR  Informed Consent: I have reviewed the patients History and Physical, chart, labs and discussed the procedure including the risks, benefits and alternatives for the proposed anesthesia with the patient or authorized representative who has indicated his/her understanding and acceptance.     Plan Discussed with: CRNA, Anesthesiologist and Surgeon  Anesthesia Plan Comments:         Anesthesia Quick Evaluation

## 2014-07-02 NOTE — Progress Notes (Signed)
PATIENT DETAILS Name: Jill Chavez Age: 64 y.o. Sex: female Date of Birth: 07/14/1950 Admit Date: 06/28/2014 Admitting Physician Phillips Grout, MD PCP:No PCP Per Patient  Brief Narrative: Jill Chavez is a 64 yo F with a PMH of hypothyroidism who was initially seen for evaluation of her recent difficulty balancing, change in behavior and increased falls. CT of head noted multiple brain masses and an acute hemorrhage with a 34 mm mass to the R frontal lobe. Following MRI identified 6 enhancing brain masses. Pt was then admitted to the stepdown unit and started on IV Decadron.  Neurosurgery has been consulted and plan for stereotactic craniotomy w/ removal of the large R frontal lesion today, 4/20. Subsequent Ct of abdomen and chest revealed no primary malignancy. Plan is for her to been seen by oncology once biopsy from surgery is available.   Subjective: Doing well, ready for the surgery to begin.   Assessment/Plan: Multiple brain masses including a 34 mm R frontal lobe brain mass w/ edema and hemorrhage/hematoma: Presents w/ ataxia and hx of falls, CT of head noted multiple brain masses and an acute hemorrhage w/ 34 mm mass to R frontal lobe. Following MRI of brain shows 6 enhancing brain masses. Started on Decadron and neurosurgery has been consulted. Stereotactic craniotomy on 4/20. Most likely etiology is malignancy, see below discussion.  Suspect brain malignancy- primary CNS neoplasm vs mets w/ an unknown primary: CT head shows 4 distinct brain lesions (18 mm mass in R temporal lobe, 9 mm mass in upper right cerebellum, indistinct mass in L temporal lobe, and 34 mm mass to R frontal lobe.) CT chest and abdomen show no primary malignancy or other metastatic disease. MRI of head shows 6 enhancing brain masses all near surface of brain. Radiology suggested top elements of differential should include melanoma, choriocarcinoma, high-grade glioma and CNS lymphoma. On exam, she has  no suspicious looking moles on her skin. Per pt, last colonoscopy, mammography and Pap smear were more than 10 yrs ago. Due to no foci of primary malignancy evident on exam or on radiologic studies, suspect diagnosis would be dependent on pathology specimen from craniotomy.   Hypothyroidism: Continue Synthroid  Mild Hyperglycemia: Likely 2/2 Decadron, continue SSI - CBGs stable.  Disposition: Remain inpatient, disposition dependent on post craniotomy eval.  Antimicrobial agents  See below  Anti-infectives    None      DVT Prophylaxis: SCD's  Code Status: Full code  Family Communication Family aware of plan  Procedures: None  CONSULTS:  Neurosurgery   MEDICATIONS: Scheduled Meds: . dexamethasone  10 mg Intravenous 4 times per day  . insulin aspart  0-20 Units Subcutaneous TID WC  . insulin aspart  0-5 Units Subcutaneous QHS  . levothyroxine  125 mcg Oral QAC breakfast  . pantoprazole  40 mg Oral BID   Continuous Infusions:  PRN Meds:.acetaminophen, ondansetron (ZOFRAN) IV    PHYSICAL EXAM: Vital signs in last 24 hours: Filed Vitals:   07/01/14 2133 07/02/14 0152 07/02/14 0528 07/02/14 0945  BP: 159/82 157/83 147/70 153/67  Pulse: 63 64 57 54  Temp: 98.3 F (36.8 C) 98.4 F (36.9 C) 98.8 F (37.1 C) 98.1 F (36.7 C)  TempSrc: Oral Oral Oral Oral  Resp: 20 18 18 18   Height:      Weight:      SpO2: 98% 94% 93% 91%    Weight change:  [employer]  06/28/14 2122 06/29/14 0247 06/30/14 0324  Weight: 76.204 kg (168 lb) 76.7 kg (169 lb 1.5 oz) 76.7 kg (169 lb 1.5 oz)   Body mass index is 28.14 kg/(m^2).   Gen Exam: Awake and alert with clear speech.  Not in any distress Neck: Supple, No JVD.  Chest: B/L Clear. No rales or rhonchi CVS: S1 S2 Regular, no murmurs.  Abdomen: soft, BS +, non tender, non distended.  Extremities: no edema, lower extremities warm to touch. Neurologic: Non Focal.   Skin: No Rash.   Wounds: N/A.    Intake/Output from  previous day:  Intake/Output Summary (Last 24 hours) at 07/02/14 1056 Last data filed at 07/01/14 1700  Gross per 24 hour  Intake    720 ml  Output      0 ml  Net    720 ml     LAB RESULTS: CBC  Recent Labs Lab 06/28/14 2157 06/29/14 0712 06/30/14 0235  WBC 8.4 12.1* 17.8*  HGB 12.7 12.9 13.2  HCT 39.0 40.7 41.2  PLT 263 260 298  MCV 89.2 89.1 88.0  MCH 29.1 28.2 28.2  MCHC 32.6 31.7 32.0  RDW 13.6 13.7 13.8  LYMPHSABS 1.4  --   --   MONOABS 0.6  --   --   EOSABS 0.0  --   --   BASOSABS 0.0  --   --     Chemistries   Recent Labs Lab 06/28/14 2157 06/29/14 0712 06/30/14 0235  NA 143 137 140  K 3.9 3.8 3.8  CL 108 102 106  CO2 26 24 20   GLUCOSE 115* 141* 164*  BUN 16 12 14   CREATININE 0.61 0.58 0.83  CALCIUM 10.8* 10.5 10.9*    CBG:  Recent Labs Lab 07/01/14 0629 07/01/14 1135 07/01/14 1616 07/01/14 2135 07/02/14 0652  GLUCAP 139* 148* 186* 178* 131*    GFR Estimated Creatinine Clearance: 71.1 mL/min (by C-G formula based on Cr of 0.83).  Coagulation profile  Recent Labs Lab 06/30/14 0235  INR 1.14    Cardiac Enzymes  Recent Labs Lab 06/28/14 2157  TROPONINI <0.03    Invalid input(s): POCBNP No results for input(s): DDIMER in the last 72 hours. No results for input(s): HGBA1C in the last 72 hours. No results for input(s): CHOL, HDL, LDLCALC, TRIG, CHOLHDL, LDLDIRECT in the last 72 hours. No results for input(s): TSH, T4TOTAL, T3FREE, THYROIDAB in the last 72 hours.  Invalid input(s): FREET3 No results for input(s): VITAMINB12, FOLATE, FERRITIN, TIBC, IRON, RETICCTPCT in the last 72 hours. No results for input(s): LIPASE, AMYLASE in the last 72 hours.  Urine Studies No results for input(s): UHGB, CRYS in the last 72 hours.  Invalid input(s): UACOL, UAPR, USPG, UPH, UTP, UGL, UKET, UBIL, UNIT, UROB, ULEU, UEPI, UWBC, URBC, UBAC, CAST, UCOM, BILUA  MICROBIOLOGY: Recent Results (from the past 240 hour(s))  MRSA PCR Screening      Status: None   Collection Time: 06/29/14  2:43 AM  Result Value Ref Range Status   MRSA by PCR NEGATIVE NEGATIVE Final    Comment:        The GeneXpert MRSA Assay (FDA approved for NASAL specimens only), is one component of a comprehensive MRSA colonization surveillance program. It is not intended to diagnose MRSA infection nor to guide or monitor treatment for MRSA infections.     RADIOLOGY STUDIES/RESULTS: Ct Head Wo Contrast  06/28/2014   CLINICAL DATA:  Patient not acting the same. Stuttering since Wednesday.  EXAM: CT HEAD  WITHOUT CONTRAST  TECHNIQUE: Contiguous axial images were obtained from the base of the skull through the vertex without intravenous contrast.  COMPARISON:  None.  FINDINGS: Skull and Sinuses:Negative for fracture or destructive process. The mastoids, middle ears, and imaged paranasal sinuses are clear.  Orbits: No acute abnormality.  Brain: There are at least 4 hyper dense intra-axial masses, the largest in the right frontal lobe measuring 34 mm. This mass is complicated by hematoma which extends posteriorly into the right frontal white matter, measuring 28 x 39 x 30 mm. There is also an 18 mm mass in the right temporal lobe, an indistinct mass in the left temporal lobe, and a 9 mm mass in the upper right cerebellum. Patchy vasogenic edema, maximal around the hemorrhagic lesion on the right. There is mass effect and midline shift measuring up to 13 mm. No ACA infarct. The left temporal horn is mildly dilated, but no overt hydrocephalus.  Critical Value/emergent results were called by telephone at the time of interpretation on 06/28/2014 at 10:40 pm to Dr. Tanna Furry , who verbally acknowledged these results.  IMPRESSION: 1. At least 4 brain masses, a metastatic pattern. A 34 mm mass in the right frontal lobe has undergone acute hemorrhage with a 16 cc volume hematoma. 2. Patchy vasogenic edema from #1, maximal in the right frontal lobe with subfalcine herniation and  developing left lateral ventricle entrapment. 3. 13 mm calcified left parafalcine meningioma.   Electronically Signed   By: Monte Fantasia M.D.   On: 06/28/2014 22:42   Ct Chest W Contrast  06/29/2014   CLINICAL DATA:  Brain mass.  EXAM: CT CHEST, ABDOMEN, AND PELVIS WITH CONTRAST  TECHNIQUE: Multidetector CT imaging of the chest, abdomen and pelvis was performed following the standard protocol during bolus administration of intravenous contrast.  CONTRAST:  136mL OMNIPAQUE IOHEXOL 300 MG/ML  SOLN  COMPARISON:  None.  FINDINGS: CT CHEST FINDINGS  THORACIC INLET/BODY WALL:  No acute abnormality.  MEDIASTINUM:  Mild cardiomegaly. There is fusiform aneurysmal enlargement of the ascending aorta to 41 mm. No acute vascular findings. No lymphadenopathy. Negative esophagus.  LUNG WINDOWS:  No suspicious pulmonary nodule.  OSSEOUS:  No suspicious lytic or blastic lesions.  CT ABDOMEN AND PELVIS FINDINGS  BODY WALL: No contributory findings.  Liver: No focal abnormality.  Biliary: No evidence of biliary obstruction or stone.  Pancreas: Unremarkable.  Spleen: Unremarkable.  Adrenals: Unremarkable.  Kidneys and ureters: No evidence of solid renal mass. There is a 42 mm cyst in the lower pole left kidney. Right renal cortical low densities are too small to characterize but favor cyst. No hydronephrosis.  Bladder: Unremarkable.  Reproductive: No pathologic findings.  Bowel: Negative.  Retroperitoneum: No mass or adenopathy.  Peritoneum: No ascites or pneumoperitoneum.  Vascular: No acute abnormality.  OSSEOUS:  No suspicious lytic or blastic lesions.  IMPRESSION: 1. No evidence of primary malignancy or metastatic disease in the chest or abdomen. 2. 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   Electronically Signed   By: Monte Fantasia M.D.   On: 06/29/2014 05:10   Mr [name] CL Contrast  06/29/2014   CLINICAL DATA:  64 year old female with multiple brain masses discovered on head CT for altered  mental status. No primary malignancy identified recently by CT in the chest abdomen or pelvis. Initial encounter.  EXAM: MRI HEAD WITHOUT AND WITH CONTRAST  TECHNIQUE: Multiplanar, multiecho pulse sequences of the brain and surrounding structures were obtained without and  with intravenous contrast.  CONTRAST:  48mL MULTIHANCE GADOBENATE DIMEGLUMINE 529 MG/ML IV SOLN  COMPARISON:  Head CT without contrast 06/28/2014.  FINDINGS: Motion artifact on post-contrast images.  Six enhancing brain masses are identified. All appear to be intra-axial, but all are located near the surface of the brain. The largest masses in the anterior right frontal lobe and has hemorrhaged. The blood products encompass 54 x 51 x 32 mm. The lobulated enhancing component of the mass is 45 x 32 x 41 mm. The most solid enhancing component of the mass is isointense to gray matter on T2 with diffusion restriction. Moderate to severe surrounding vasogenic edema which tracks into the genu of the corpus callosum an just across midline, without associated enhancement. Significant regional mass effect with regional midline shift up to 14 mm.  The other 5 enhancing metastases measure up to 24 mm. Of these, a lesion at the left superior frontal gyrus abutting the Fox and a lesion in the right superior cerebellum show definite internal hemorrhage, but no surrounding edema. The largest of these is located near the right sylvian fissure in the temporal stem and has both solid and cystic components.  Also, all but the left superior parasagittal lesion demonstrate intrinsic T1 hyperintensity, which for 2 lesions does not seem related to blood products.  No other blood products are identified within the brain. Mass effect on the lateral and third ventricles without ventriculomegaly. Basilar cisterns remain patent. Partially empty sella configuration. No extra-axial fluid or hemorrhage. No restricted diffusion or evidence of acute infarction. Major intracranial  vascular flow voids are preserved. Mass effect on the bilateral ACAs.  Normal visualized bone marrow signal. Grossly normal visualized cervical spinal cord.  Degenerative disc herniation at C3-C4 resulting in mild spinal stenosis. Visualized paranasal sinuses and mastoids are clear. Visualized orbit soft tissues are within normal limits. Visualized scalp soft tissues are within normal limits.  IMPRESSION: 1. Six enhancing brain masses are identified. All appearing intra-axial, but are located near the surface of the brain. The largest in the anterior right frontal lobe has hemorrhaged into the brain. Two other small lesions have bled internally without surrounding edema. Two lesions have intrinsic T1 hyperintensity which appears unrelated to blood products. 2. Top differential considerations in light of no primary tumor identified on CT chest abdomen and pelvis include: - Melanoma metastases (favored), - Other hypervascular metastasis such as thyroid (I note the thyroid is absent) or choriocarcinoma, - Multifocal high-grade glioma - CNS lymphoma (least likely). 3. Moderate to severe regional mass effect in the anterior frontal lobes, with midline shift up to 14 mm. Basilar cisterns remain patent.   Electronically Signed   By: Genevie Ann M.D.   On: 06/29/2014 12:09   Ct Abdomen Pelvis W Contrast  06/29/2014   CLINICAL DATA:  Brain mass.  EXAM: CT CHEST, ABDOMEN, AND PELVIS WITH CONTRAST  TECHNIQUE: Multidetector CT imaging of the chest, abdomen and pelvis was performed following the standard protocol during bolus administration of intravenous contrast.  CONTRAST:  195mL OMNIPAQUE IOHEXOL 300 MG/ML  SOLN  COMPARISON:  None.  FINDINGS: CT CHEST FINDINGS  THORACIC INLET/BODY WALL:  No acute abnormality.  MEDIASTINUM:  Mild cardiomegaly. There is fusiform aneurysmal enlargement of the ascending aorta to 41 mm. No acute vascular findings. No lymphadenopathy. Negative esophagus.  LUNG WINDOWS:  No suspicious pulmonary  nodule.  OSSEOUS:  No suspicious lytic or blastic lesions.  CT ABDOMEN AND PELVIS FINDINGS  BODY WALL: No contributory findings.  Liver: No  focal abnormality.  Biliary: No evidence of biliary obstruction or stone.  Pancreas: Unremarkable.  Spleen: Unremarkable.  Adrenals: Unremarkable.  Kidneys and ureters: No evidence of solid renal mass. There is a 42 mm cyst in the lower pole left kidney. Right renal cortical low densities are too small to characterize but favor cyst. No hydronephrosis.  Bladder: Unremarkable.  Reproductive: No pathologic findings.  Bowel: Negative.  Retroperitoneum: No mass or adenopathy.  Peritoneum: No ascites or pneumoperitoneum.  Vascular: No acute abnormality.  OSSEOUS:  No suspicious lytic or blastic lesions.  IMPRESSION: 1. No evidence of primary malignancy or metastatic disease in the chest or abdomen. 2. 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   Electronically Signed   By: Monte Fantasia M.D.   On: 06/29/2014 05:10    Tommie Raymond, PA-S Oren Binet, MD  [facility] Pager:[phone]  If 7PM-7AM, please contact night-coverage [email or web address] Password [email or web address] 07/02/2014, 10:56 AM   LOS: 4 days   Attending Patient was seen, examined,treatment plan was discussed with PA-S.  I have directly reviewed the clinical findings, lab, imaging studies and management of this patient in detail. I have made the necessary changes to the above noted documentation, and agree with the documentation, as recorded by the Advance Practice Provider.   Doing well, vital signs and CBGs are stable. For craniotomy today. Will require oncology consultation post craniotomy once biopsy results are available.  Nena Alexander MD [facility].

## 2014-07-02 NOTE — Anesthesia Postprocedure Evaluation (Signed)
  Anesthesia Post-op Note  Patient: Jill Chavez  Procedure(s) Performed: Procedure(s) with comments: Stereotactic Craniotomy for right frontal brain tumor excision w/BrainLab (Right) - right  APPLICATION OF CRANIAL NAVIGATION (N/A)  Patient Location: PACU  Anesthesia Type:General  Level of Consciousness: awake, alert , oriented and patient cooperative  Airway and Oxygen Therapy: Patient Spontanous Breathing and Patient connected to nasal cannula oxygen  Post-op Pain: none  Post-op Assessment: Post-op Vital signs reviewed, Patient's Cardiovascular Status Stable, Respiratory Function Stable, Patent Airway, No signs of Nausea or vomiting and Pain level controlled  Post-op Vital Signs: Reviewed and stable  Last Vitals:  Filed Vitals:   07/02/14 1930  BP: 162/78  Pulse: 50  Temp:   Resp: 15    Complications: No apparent anesthesia complications

## 2014-07-02 NOTE — Progress Notes (Signed)
Patient ID: Jill Chavez, female   DOB: [birth year], 64 y.o.   MRN: [id number] Patient is awake and alert neurologically intact   Patient presents for a bicoronal craniotomy for excision of large right frontal mass of extensor gone over the risks and benefits of the procedure with the patient and family as well as perioperative course expectations of outcome and alternatives of surgery and they understand and agree to proceed forward.

## 2014-07-02 NOTE — Anesthesia Procedure Notes (Signed)
Procedure Name: Intubation Date/Time: 07/02/2014 3:46 PM Performed by: Shirlyn Goltz Pre-anesthesia Checklist: Patient identified, Emergency Drugs available, Suction available and Patient being monitored Patient Re-evaluated:Patient Re-evaluated prior to inductionOxygen Delivery Method: Circle system utilized Preoxygenation: Pre-oxygenation with 100% oxygen Intubation Type: IV induction Ventilation: Mask ventilation without difficulty Laryngoscope Size: Mac and 3 Grade View: Grade I Tube type: Oral Tube size: 7.0 mm Number of attempts: 1 Airway Equipment and Method: Stylet Placement Confirmation: ETT inserted through vocal cords under direct vision,  positive ETCO2 and breath sounds checked- equal and bilateral Secured at: 20 cm Tube secured with: Tape Dental Injury: Teeth and Oropharynx as per pre-operative assessment

## 2014-07-02 NOTE — Op Note (Signed)
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  EBL: Minimal  History of present illness: Patient is a 64 year old female who is admitted with altered mental status workup revealed 6 metastases to her brain 5 small and one large hemorrhagic right frontal lobe mass. Workup initially is negative for known primary some patient was recommended to the OR for resection of right frontal lobe mass. I extensively went over the risks and benefits of the operation the patient as well as perioperative course expectations of outcome and alternatives to surgery and she understood and agreed to proceed forward.  Operative procedure: Patient brought in the or was induced under general anesthesia positioned supine the neck in slight flexion and looking up to the midline she was placed in pins her hairline was shaved back and then after registration with the BrainLab intraoperative stereotactic navigation system drew out a bicoronal skin incision then the patient's head was shaved prepped and draped in routine sterile fashion bicoronal skin incision was drawn out and then a right frontal craniotomy with a bur hole on the posterior aspect of the incision at the level of the super sagittal sinuses and as well as one posteriorly and laterally then I turned the flap along the superior aspect the frontal sinus as identified by the BrainLab navigation system as well as hugging the midline to the super sagittal sinus. Then utilizing the navigation system identified the lesion directly in the craniotomy flap opened up the dura and flapped against sinus. The gyri was a markedly expanded so I selected a right frontal gyrus that was expanded and made a  corticectomy a hemorrhagic tumor was immediately identified and then developing the plane with patties and a 4 [place] I created a plane all around the capsule of this lesion. Sent specimen for frozen which was consistent with metastatic carcinoma. Then continue workman margins I resected a large of that en bloc. Inspecting the margins there was almost edematous white matter no residual tumor I aspirated a lot of the hemorrhage however did not chase the hemorrhage and went down to the level of the corpus callosum. I did utilize the [employer] navigation system to further identify where the floor of my resection cavity was it was deviated hemorrhagic cavity well below the actual tumor. In a 83 of March I maintained meticulous hemostasis and inspected, margins and no no further tumor cells were appreciated. I overlaid Surgicel along the bed closed the dura with a watertight closure reattach the flap with the [employer] plating system and closed the bicoronal scalp incision with interrupted Vicryl's and a running nylon. At the end of case all needle counts sponge counts were Correct and patient went to recovery room in stable condition.

## 2014-07-03 ENCOUNTER — Inpatient Hospital Stay (HOSPITAL_COMMUNITY): Payer: 59

## 2014-07-03 DIAGNOSIS — D496 Neoplasm of unspecified behavior of brain: Principal | ICD-10-CM

## 2014-07-03 DIAGNOSIS — C801 Malignant (primary) neoplasm, unspecified: Secondary | ICD-10-CM

## 2014-07-03 DIAGNOSIS — D72829 Elevated white blood cell count, unspecified: Secondary | ICD-10-CM

## 2014-07-03 DIAGNOSIS — Z803 Family history of malignant neoplasm of breast: Secondary | ICD-10-CM

## 2014-07-03 DIAGNOSIS — Z8041 Family history of malignant neoplasm of ovary: Secondary | ICD-10-CM

## 2014-07-03 DIAGNOSIS — R739 Hyperglycemia, unspecified: Secondary | ICD-10-CM

## 2014-07-03 LAB — BASIC METABOLIC PANEL
Anion gap: 8 (ref 5–15)
BUN: 20 mg/dL (ref 6–23)
CO2: 26 mmol/L (ref 19–32)
Calcium: 9.8 mg/dL (ref 8.4–10.5)
Chloride: 105 mmol/L (ref 96–112)
Creatinine, Ser: 0.77 mg/dL (ref 0.50–1.10)
GFR calc Af Amer: >90 mL/min (ref >90)
GFR calc non Af Amer: 88 mL/min — ABNORMAL LOW (ref >90)
GLUCOSE: 126 mg/dL — AB (ref 70–99)
POTASSIUM: 4.2 mmol/L (ref 3.5–5.1)
Sodium: 139 mmol/L (ref 135–145)

## 2014-07-03 LAB — GLUCOSE, CAPILLARY
GLUCOSE-CAPILLARY: 121 mg/dL — AB (ref 70–99)
GLUCOSE-CAPILLARY: 164 mg/dL — AB (ref 70–99)
GLUCOSE-CAPILLARY: 144 mg/dL — AB (ref 70–99)
Glucose-Capillary: 119 mg/dL — ABNORMAL HIGH (ref 70–99)

## 2014-07-03 MED ORDER — GADOBENATE DIMEGLUMINE 529 MG/ML IV SOLN
15.0000 mL | Freq: Once | INTRAVENOUS | Status: AC | PRN
  Administered 2014-07-03: 15 mL via INTRAVENOUS

## 2014-07-03 MED ORDER — INSULIN ASPART 100 UNIT/ML ~~LOC~~ SOLN
0.0000 [IU] | Freq: Three times a day (TID) | SUBCUTANEOUS | Status: DC
  Administered 2014-07-03 – 2014-07-04 (×2): 4 [IU] via SUBCUTANEOUS
  Administered 2014-07-04 (×2): 3 [IU] via SUBCUTANEOUS
  Administered 2014-07-05: 4 [IU] via SUBCUTANEOUS

## 2014-07-03 NOTE — Progress Notes (Signed)
UR completed.  Await postop therapy evals to determine d/c needs.   Sandi Mariscal, RN BSN [place] CCM Trauma/Neuro ICU Case Manager [phone]

## 2014-07-03 NOTE — Progress Notes (Signed)
PATIENT DETAILS Name: Jill Chavez Age: 64 y.o. Sex: female Date of Birth: 25-Jul-1950 Admit Date: 06/28/2014 Admitting Physician Phillips Grout, MD PCP:No PCP Per Patient  Brief Narrative: Jill Chavez is a 64 yo F with a PMH of hypothyroidism who was initially seen for evaluation of her recent difficulty balancing, change in behavior and increased falls. CT of head noted multiple brain masses and an acute hemorrhage with a 34 mm mass to the R frontal lobe. Following MRI identified 6 enhancing brain masses. Subsequent Ct of abdomen and chest revealed no primary malignancy. Pt was then admitted to the stepdown unit and started on IV Decadron.  Neurosurgery was consulted and and patient underwent stereotactic craniotomy w/ removal of the large R frontal lesion on 4/20.Oncology-Dr Alen Blew has been consulted.  Subjective: Doing well, has headache this am.   Assessment/Plan: Multiple brain masses including a 34 mm R frontal lobe brain mass w/ edema and hemorrhage/hematoma: Presents w/ ataxia and hx of falls, CT of head noted multiple brain masses and an acute hemorrhage w/ 34 mm mass to R frontal lobe. Following MRI of brain showed 6 enhancing brain masses. Started on Decadron and neurosurgery consulted. Underwent Stereotactic craniotomy on 4/20.Frozen section positive for "metastatic carcinoma"-have consulted Oncology-Dr Shadad. Await Bx results.  Suspect brain malignancy- primary CNS neoplasm vs mets w/ an unknown primary: CT head shows 4 distinct brain lesions (18 mm mass in R temporal lobe, 9 mm mass in upper right cerebellum, indistinct mass in L temporal lobe, and 34 mm mass to R frontal lobe.) CT chest and abdomen show no primary malignancy or other metastatic disease. MRI of head showed 6 enhancing brain masses all near surface of brain. Radiology suggested top elements of differential should include melanoma, choriocarcinoma, high-grade glioma and CNS lymphoma. On exam, she has  no suspicious looking moles on her skin. Per pt, last colonoscopy, mammography and Pap smear were more than 10 yrs ago. See above-Oncology consulted.   Hypothyroidism: Continue Synthroid  Mild Hyperglycemia: Likely 2/2 Decadron, continue SSI - CBGs stable.  Disposition: Remain inpatient-in SDU-transfer/discharge per Neurosurgery recommendations  Antimicrobial agents  See below  Anti-infectives    Start     Dose/Rate Route Frequency Ordered Stop   07/02/14 2300  ceFAZolin (ANCEF) IVPB 1 g/50 mL premix     1 g 100 mL/hr over 30 Minutes Intravenous Every 8 hours 07/02/14 2050 07/03/14 0659   07/02/14 1520  bacitracin 50,000 Units in sodium chloride irrigation 0.9 % 500 mL irrigation  Status:  Discontinued       As needed 07/02/14 1640 07/02/14 1840      DVT Prophylaxis: SCD's  Code Status: Full code  Family Communication Husband at bedside  Procedures: None  CONSULTS:  Neurosurgery   MEDICATIONS: Scheduled Meds: . dexamethasone  6 mg Intravenous 4 times per day   Followed by  . [START ON 07/04/2014] dexamethasone  4 mg Intravenous 4 times per day   Followed by  . [START ON 07/05/2014] dexamethasone  4 mg Intravenous 3 times per day  . insulin aspart  0-20 Units Subcutaneous TID WC  . insulin aspart  0-5 Units Subcutaneous QHS  . levETIRAcetam  500 mg Intravenous Q12H  . levothyroxine  125 mcg Oral QAC breakfast  . pantoprazole  40 mg Oral BID   Continuous Infusions: . 0.9 % NaCl with KCl 20 mEq / L 75 mL/hr at 07/02/14 2300  PRN Meds:.acetaminophen **OR** acetaminophen, fentaNYL (SUBLIMAZE) injection, labetalol, ondansetron **OR** ondansetron (ZOFRAN) IV, oxyCODONE-acetaminophen, promethazine    PHYSICAL EXAM: Vital signs in last 24 hours: Filed Vitals:   07/03/14 0600 07/03/14 0700 07/03/14 0800 07/03/14 1000  BP: 140/74 144/71 126/94 109/54  Pulse: 51 49 53 61  Temp:   98.1 F (36.7 C)   TempSrc:   Oral   Resp: 20 16 17 17   Height:      Weight:        SpO2: 93% 95% 94% 94%    Weight change:  Filed Weights   06/29/14 0247 06/30/14 0324 07/03/14 0500  Weight: 76.7 kg (169 lb 1.5 oz) 76.7 kg (169 lb 1.5 oz) 76.4 kg (168 lb 6.9 oz)   Body mass index is 28.03 kg/(m^2).   Gen Exam: Awake and alert with clear speech. Bandage in head-no soaked.Not in any distress Neck: Supple, No JVD.  Chest: B/L Clear. CVS: S1 S2 Regular, no murmurs.  Abdomen: soft, BS +, non tender, non distended.  Extremities: no edema, lower extremities warm to touch. Neurologic: Non Focal. Speech clear  Skin: No Rash.   Wounds: N/A.    Intake/Output from previous day:  Intake/Output Summary (Last 24 hours) at 07/03/14 1034 Last data filed at 07/03/14 0800  Gross per 24 hour  Intake 2336.25 ml  Output   1575 ml  Net 761.25 ml     LAB RESULTS: CBC  Recent Labs Lab 06/28/14 2157 06/29/14 0712 06/30/14 0235  WBC 8.4 12.1* 17.8*  HGB 12.7 12.9 13.2  HCT 39.0 40.7 41.2  PLT 263 260 298  MCV 89.2 89.1 88.0  MCH 29.1 28.2 28.2  MCHC 32.6 31.7 32.0  RDW 13.6 13.7 13.8  LYMPHSABS 1.4  --   --   MONOABS 0.6  --   --   EOSABS 0.0  --   --   BASOSABS 0.0  --   --     Chemistries   Recent Labs Lab 06/28/14 2157 06/29/14 0712 06/30/14 0235 07/02/14 2344  NA 143 137 140 139  K 3.9 3.8 3.8 4.2  CL 108 102 106 105  CO2 26 24 20 26   GLUCOSE 115* 141* 164* 126*  BUN 16 12 14 20   CREATININE 0.61 0.58 0.83 0.77  CALCIUM 10.8* 10.5 10.9* 9.8    CBG:  Recent Labs Lab 07/01/14 2135 07/02/14 0652 07/02/14 1108 07/02/14 2129 07/03/14 0808  GLUCAP 178* 131* 126* 125* 121*    GFR Estimated Creatinine Clearance: 73.6 mL/min (by C-G formula based on Cr of 0.77).  Coagulation profile  Recent Labs Lab 06/30/14 0235  INR 1.14    Cardiac Enzymes  Recent Labs Lab 06/28/14 2157  TROPONINI <0.03    Invalid input(s): POCBNP No results for input(s): DDIMER in the last 72 hours. No results for input(s): HGBA1C in the last 72 hours. No  results for input(s): CHOL, HDL, LDLCALC, TRIG, CHOLHDL, LDLDIRECT in the last 72 hours. No results for input(s): TSH, T4TOTAL, T3FREE, THYROIDAB in the last 72 hours.  Invalid input(s): FREET3 No results for input(s): VITAMINB12, FOLATE, FERRITIN, TIBC, IRON, RETICCTPCT in the last 72 hours. No results for input(s): LIPASE, AMYLASE in the last 72 hours.  Urine Studies No results for input(s): UHGB, CRYS in the last 72 hours.  Invalid input(s): UACOL, UAPR, USPG, UPH, UTP, UGL, UKET, UBIL, UNIT, UROB, ULEU, UEPI, UWBC, URBC, UBAC, CAST, UCOM, BILUA  MICROBIOLOGY: Recent Results (from the past 240 hour(s))  MRSA PCR Screening  Status: None   Collection Time: 06/29/14  2:43 AM  Result Value Ref Range Status   MRSA by PCR NEGATIVE NEGATIVE Final    Comment:        The GeneXpert MRSA Assay (FDA approved for NASAL specimens only), is one component of a comprehensive MRSA colonization surveillance program. It is not intended to diagnose MRSA infection nor to guide or monitor treatment for MRSA infections.     RADIOLOGY STUDIES/RESULTS: Ct Head Wo Contrast  06/28/2014   CLINICAL DATA:  Patient not acting the same. Stuttering since Wednesday.  EXAM: CT HEAD WITHOUT CONTRAST  TECHNIQUE: Contiguous axial images were obtained from the base of the skull through the vertex without intravenous contrast.  COMPARISON:  None.  FINDINGS: Skull and Sinuses:Negative for fracture or destructive process. The mastoids, middle ears, and imaged paranasal sinuses are clear.  Orbits: No acute abnormality.  Brain: There are at least 4 hyper dense intra-axial masses, the largest in the right frontal lobe measuring 34 mm. This mass is complicated by hematoma which extends posteriorly into the right frontal white matter, measuring 28 x 39 x 30 mm. There is also an 18 mm mass in the right temporal lobe, an indistinct mass in the left temporal lobe, and a 9 mm mass in the upper right cerebellum. Patchy vasogenic  edema, maximal around the hemorrhagic lesion on the right. There is mass effect and midline shift measuring up to 13 mm. No ACA infarct. The left temporal horn is mildly dilated, but no overt hydrocephalus.  Critical Value/emergent results were called by telephone at the time of interpretation on 06/28/2014 at 10:40 pm to Dr. Tanna Furry , who verbally acknowledged these results.  IMPRESSION: 1. At least 4 brain masses, a metastatic pattern. A 34 mm mass in the right frontal lobe has undergone acute hemorrhage with a 16 cc volume hematoma. 2. Patchy vasogenic edema from #1, maximal in the right frontal lobe with subfalcine herniation and developing left lateral ventricle entrapment. 3. 13 mm calcified left parafalcine meningioma.   Electronically Signed   By: Monte Fantasia M.D.   On: 06/28/2014 22:42   Ct Chest W Contrast  06/29/2014   CLINICAL DATA:  Brain mass.  EXAM: CT CHEST, ABDOMEN, AND PELVIS WITH CONTRAST  TECHNIQUE: Multidetector CT imaging of the chest, abdomen and pelvis was performed following the standard protocol during bolus administration of intravenous contrast.  CONTRAST:  136mL OMNIPAQUE IOHEXOL 300 MG/ML  SOLN  COMPARISON:  None.  FINDINGS: CT CHEST FINDINGS  THORACIC INLET/BODY WALL:  No acute abnormality.  MEDIASTINUM:  Mild cardiomegaly. There is fusiform aneurysmal enlargement of the ascending aorta to 41 mm. No acute vascular findings. No lymphadenopathy. Negative esophagus.  LUNG WINDOWS:  No suspicious pulmonary nodule.  OSSEOUS:  No suspicious lytic or blastic lesions.  CT ABDOMEN AND PELVIS FINDINGS  BODY WALL: No contributory findings.  Liver: No focal abnormality.  Biliary: No evidence of biliary obstruction or stone.  Pancreas: Unremarkable.  Spleen: Unremarkable.  Adrenals: Unremarkable.  Kidneys and ureters: No evidence of solid renal mass. There is a 42 mm cyst in the lower pole left kidney. Right renal cortical low densities are too small to characterize but favor cyst. No  hydronephrosis.  Bladder: Unremarkable.  Reproductive: No pathologic findings.  Bowel: Negative.  Retroperitoneum: No mass or adenopathy.  Peritoneum: No ascites or pneumoperitoneum.  Vascular: No acute abnormality.  OSSEOUS:  No suspicious lytic or blastic lesions.  IMPRESSION: 1. No evidence of primary malignancy or metastatic  disease in the chest or abdomen. 2. 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   Electronically Signed   By: Monte Fantasia M.D.   On: 06/29/2014 05:10   Mr [name] GD Contrast  06/29/2014   CLINICAL DATA:  64 year old female with multiple brain masses discovered on head CT for altered mental status. No primary malignancy identified recently by CT in the chest abdomen or pelvis. Initial encounter.  EXAM: MRI HEAD WITHOUT AND WITH CONTRAST  TECHNIQUE: Multiplanar, multiecho pulse sequences of the brain and surrounding structures were obtained without and with intravenous contrast.  CONTRAST:  36mL MULTIHANCE GADOBENATE DIMEGLUMINE 529 MG/ML IV SOLN  COMPARISON:  Head CT without contrast 06/28/2014.  FINDINGS: Motion artifact on post-contrast images.  Six enhancing brain masses are identified. All appear to be intra-axial, but all are located near the surface of the brain. The largest masses in the anterior right frontal lobe and has hemorrhaged. The blood products encompass 54 x 51 x 32 mm. The lobulated enhancing component of the mass is 45 x 32 x 41 mm. The most solid enhancing component of the mass is isointense to gray matter on T2 with diffusion restriction. Moderate to severe surrounding vasogenic edema which tracks into the genu of the corpus callosum an just across midline, without associated enhancement. Significant regional mass effect with regional midline shift up to 14 mm.  The other 5 enhancing metastases measure up to 24 mm. Of these, a lesion at the left superior frontal gyrus abutting the Fox and a lesion in the right superior cerebellum show  definite internal hemorrhage, but no surrounding edema. The largest of these is located near the right sylvian fissure in the temporal stem and has both solid and cystic components.  Also, all but the left superior parasagittal lesion demonstrate intrinsic T1 hyperintensity, which for 2 lesions does not seem related to blood products.  No other blood products are identified within the brain. Mass effect on the lateral and third ventricles without ventriculomegaly. Basilar cisterns remain patent. Partially empty sella configuration. No extra-axial fluid or hemorrhage. No restricted diffusion or evidence of acute infarction. Major intracranial vascular flow voids are preserved. Mass effect on the bilateral ACAs.  Normal visualized bone marrow signal. Grossly normal visualized cervical spinal cord.  Degenerative disc herniation at C3-C4 resulting in mild spinal stenosis. Visualized paranasal sinuses and mastoids are clear. Visualized orbit soft tissues are within normal limits. Visualized scalp soft tissues are within normal limits.  IMPRESSION: 1. Six enhancing brain masses are identified. All appearing intra-axial, but are located near the surface of the brain. The largest in the anterior right frontal lobe has hemorrhaged into the brain. Two other small lesions have bled internally without surrounding edema. Two lesions have intrinsic T1 hyperintensity which appears unrelated to blood products. 2. Top differential considerations in light of no primary tumor identified on CT chest abdomen and pelvis include: - Melanoma metastases (favored), - Other hypervascular metastasis such as thyroid (I note the thyroid is absent) or choriocarcinoma, - Multifocal high-grade glioma - CNS lymphoma (least likely). 3. Moderate to severe regional mass effect in the anterior frontal lobes, with midline shift up to 14 mm. Basilar cisterns remain patent.   Electronically Signed   By: Genevie Ann M.D.   On: 06/29/2014 12:09   Ct Abdomen  Pelvis W Contrast  06/29/2014   CLINICAL DATA:  Brain mass.  EXAM: CT CHEST, ABDOMEN, AND PELVIS WITH CONTRAST  TECHNIQUE: Multidetector CT imaging of  the chest, abdomen and pelvis was performed following the standard protocol during bolus administration of intravenous contrast.  CONTRAST:  145mL OMNIPAQUE IOHEXOL 300 MG/ML  SOLN  COMPARISON:  None.  FINDINGS: CT CHEST FINDINGS  THORACIC INLET/BODY WALL:  No acute abnormality.  MEDIASTINUM:  Mild cardiomegaly. There is fusiform aneurysmal enlargement of the ascending aorta to 41 mm. No acute vascular findings. No lymphadenopathy. Negative esophagus.  LUNG WINDOWS:  No suspicious pulmonary nodule.  OSSEOUS:  No suspicious lytic or blastic lesions.  CT ABDOMEN AND PELVIS FINDINGS  BODY WALL: No contributory findings.  Liver: No focal abnormality.  Biliary: No evidence of biliary obstruction or stone.  Pancreas: Unremarkable.  Spleen: Unremarkable.  Adrenals: Unremarkable.  Kidneys and ureters: No evidence of solid renal mass. There is a 42 mm cyst in the lower pole left kidney. Right renal cortical low densities are too small to characterize but favor cyst. No hydronephrosis.  Bladder: Unremarkable.  Reproductive: No pathologic findings.  Bowel: Negative.  Retroperitoneum: No mass or adenopathy.  Peritoneum: No ascites or pneumoperitoneum.  Vascular: No acute abnormality.  OSSEOUS:  No suspicious lytic or blastic lesions.  IMPRESSION: 1. No evidence of primary malignancy or metastatic disease in the chest or abdomen. 2. 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   Electronically Signed   By: Monte Fantasia M.D.   On: 06/29/2014 05:10   Dg Chest Port 1 View  07/02/2014   CLINICAL DATA:  Central line placement  EXAM: PORTABLE CHEST - 1 VIEW  COMPARISON:  None.  FINDINGS: Right IJ central line, tip at the SVC level.  There is widening of the upper mediastinum which is similar to scout from 06/29/2014 chest CT, likely combination of positioning  and patient's known ascending aortic aneurysm. Chronic mild cardiomegaly. There is interstitial crowding without collapse or edema. No effusion or pneumothorax.  IMPRESSION: Right IJ central line is in good position.  No pneumothorax.   Electronically Signed   By: Monte Fantasia M.D.   On: 07/02/2014 21:12    Oren Binet, MD  [facility] Pager:[phone]  If 7PM-7AM, please contact night-coverage [email or web address] Password [email or web address] 07/03/2014, 10:34 AM   LOS: 5 days

## 2014-07-03 NOTE — Consult Note (Signed)
[place]  [phone] NOTE  Jill Chavez                                MR#: [id number]  DOB: [birth year]                       CSN#: [id number]  Referring MD: Dr. Sheliah [facility]   Reason for Consult: Brain tumors   GYJ:EHUD Jill Chavez is a 64 y.o. female admitted with a two-week history of progressive gait instability especially with walking, as well as left-sided weakness. On admission, the patient denies any vision changes, seizures, but she did have frontal headaches, initially attributed to sinus. She denies any nausea or vomiting. She denies any vertigo. She did have difficulty with concentration, with mild confusion. She denies any dysphagia. She reports taste changes in food, with mild weight loss. She denies any respiratory or cardiac complaints. She denies any bowel or urinary incontinence. She has some myalgias, especially at the knees, which she attributed to arthritis. She denies any sensory deficiencies. On presentation, a CT at the emergency department demonstrated multiple brain masses, bilateral frontoparietal small lesions, with one large right frontal lesion with significant vasogenic edema and mild to moderate amount of mass effect. She was placed on IV Decadron and Keppra. MRI of the brain on admission, confirmed 6 enhancing brain masses, all near surface of the brain.The largest in the anterior right frontal lobe measured 45 x 32 x 41 mm and  was hemorrhagic into the brain. There was moderate to severe regional mass effect in the anterior frontal lobes with midline shift after 14 mm. CT of the chest abdomen and pelvis were negative for occult disease. Patient denies any history of cancer, she does have a family history of breast and ovarian cancer. She denies any suspicious skin lesions. She has a history of hypothyroidism, otherwise she was healthy prior to this admission.She denies any tobacco or alcohol abuse.  She is up-to-date with screening programs. Her last colonoscopy was close to 10 years ago. She underwent stereotactic craniotomy on 07/02/2014. 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 Formal pathology report is pending.       PMH:  Past Medical History  Diagnosis Date  . Arthritis   . Hypothyroid   . GERD (gastroesophageal reflux disease)     Surgeries: S/p stereotactic bicoronal craniotomy for resection of right frontal hemorrhagic mass 07/02/2014 Dr. Saintclair Halsted  Allergies: No Known Allergies  Medications:   Scheduled Meds: . dexamethasone  6 mg Intravenous 4 times per day   Followed by  . [START ON 07/04/2014] dexamethasone  4 mg Intravenous 4 times per day   Followed by  . [START ON 07/05/2014] dexamethasone  4 mg Intravenous 3 times per day  . insulin aspart  0-20 Units Subcutaneous TID WC  . insulin aspart  0-5 Units Subcutaneous QHS  . levETIRAcetam  500 mg Intravenous Q12H  . levothyroxine  125 mcg Oral QAC breakfast  . pantoprazole  40 mg Oral BID   Continuous Infusions: . 0.9 % NaCl with KCl 20 mEq / L 75 mL/hr at 07/02/14 2300   PRN Meds:.acetaminophen **OR** acetaminophen, fentaNYL (SUBLIMAZE) injection, labetalol, ondansetron **OR** ondansetron (ZOFRAN) IV, oxyCODONE-acetaminophen, promethazine   ROS: See HPI for significant positives, and the rest of the review of systems is negative.  Family History:   Mother  died with breast cancer One sister died with ovarian cancer She has one brother with prostate cancer 1 daughter died with pneumonia at age 43  Social History:  reports that she has never smoked. She does not have any smokeless tobacco history on file. She reports that she does not drink alcohol or use illicit drugs. Married, 3 children. Lives in [place].     Physical Exam    ECOG PERFORMANCE STATUS: 3  Filed Vitals:   07/03/14 0800  BP: 126/94  Pulse: 53  Temp: 98.1 F (36.7 C)  Resp: 17   Filed Weights    06/29/14 0247 06/30/14 0324 07/03/14 0500  Weight: 169 lb 1.5 oz (76.7 kg) 169 lb 1.5 oz (76.7 kg) 168 lb 6.9 oz (76.4 kg)    GENERAL:alert, no distress and comfortable SKIN: skin color, texture, turgor are normal. Post operative bandage present around her head.  EYES: Right eye ptosis, conjunctiva are pink and non-injected, sclera clear OROPHARYNX:no exudate, no erythema and lips, buccal mucosa, and tongue normal  NECK: supple, thyroid normal size, non-tender, without nodularity LYMPH:  no palpable lymphadenopathy in the cervical, axillary or inguinal LUNGS: clear to auscultation and percussion with normal breathing effort HEART: regular rate & rhythm and no murmurs and no lower extremity edema ABDOMEN:abdomen soft, non-tender and normal bowel sounds Musculoskeletal:no cyanosis of digits and no clubbing  PSYCH: alert & oriented x 3 with fluent speech NEURO: gait not tested. No gross sensory or motor deficits.  Labs:  CBC   Recent Labs Lab 06/28/14 2157 06/29/14 0712 06/30/14 0235  WBC 8.4 12.1* 17.8*  HGB 12.7 12.9 13.2  HCT 39.0 40.7 41.2  PLT 263 260 298  MCV 89.2 89.1 88.0  MCH 29.1 28.2 28.2  MCHC 32.6 31.7 32.0  RDW 13.6 13.7 13.8  LYMPHSABS 1.4  --   --   MONOABS 0.6  --   --   EOSABS 0.0  --   --   BASOSABS 0.0  --   --      CMP    Recent Labs Lab 06/28/14 2157 06/29/14 0712 06/30/14 0235 07/02/14 2344  NA 143 137 140 139  K 3.9 3.8 3.8 4.2  CL 108 102 106 105  CO2 26 24 20 26   GLUCOSE 115* 141* 164* 126*  BUN 16 12 14 20   CREATININE 0.61 0.58 0.83 0.77  CALCIUM 10.8* 10.5 10.9* 9.8  AST  --   --  19  --   ALT  --   --  12  --   ALKPHOS  --   --  63  --   BILITOT  --   --  0.8  --         Component Value Date/Time   BILITOT 0.8 06/30/2014 0235      Recent Labs Lab 06/30/14 0235  INR 1.14      Imaging Studies:  Ct Head Wo Contrast  06/28/2014   CLINICAL DATA:  Patient not acting the same. Stuttering since Wednesday.  EXAM: CT  HEAD WITHOUT CONTRAST  TECHNIQUE: Contiguous axial images were obtained from the base of the skull through the vertex without intravenous contrast.  COMPARISON:  None.  FINDINGS: Skull and Sinuses:Negative for fracture or destructive process. The mastoids, middle ears, and imaged paranasal sinuses are clear.  Orbits: No acute abnormality.  Brain: There are at least 4 hyper dense intra-axial masses, the largest in the right frontal lobe measuring 34 mm. This mass is complicated by hematoma which extends posteriorly  into the right frontal white matter, measuring 28 x 39 x 30 mm. There is also an 18 mm mass in the right temporal lobe, an indistinct mass in the left temporal lobe, and a 9 mm mass in the upper right cerebellum. Patchy vasogenic edema, maximal around the hemorrhagic lesion on the right. There is mass effect and midline shift measuring up to 13 mm. No ACA infarct. The left temporal horn is mildly dilated, but no overt hydrocephalus.  Critical Value/emergent results were called by telephone at the time of interpretation on 06/28/2014 at 10:40 pm to Dr. Tanna Furry , who verbally acknowledged these results.  IMPRESSION: 1. At least 4 brain masses, a metastatic pattern. A 34 mm mass in the right frontal lobe has undergone acute hemorrhage with a 16 cc volume hematoma. 2. Patchy vasogenic edema from #1, maximal in the right frontal lobe with subfalcine herniation and developing left lateral ventricle entrapment. 3. 13 mm calcified left parafalcine meningioma.   Electronically Signed   By: Monte Fantasia M.D.   On: 06/28/2014 22:42   Ct Chest W Contrast  06/29/2014   CLINICAL DATA:  Brain mass.  EXAM: CT CHEST, ABDOMEN, AND PELVIS WITH CONTRAST  TECHNIQUE: Multidetector CT imaging of the chest, abdomen and pelvis was performed following the standard protocol during bolus administration of intravenous contrast.  CONTRAST:  141mL OMNIPAQUE IOHEXOL 300 MG/ML  SOLN  COMPARISON:  None.  FINDINGS: CT CHEST FINDINGS   THORACIC INLET/BODY WALL:  No acute abnormality.  MEDIASTINUM:  Mild cardiomegaly. There is fusiform aneurysmal enlargement of the ascending aorta to 41 mm. No acute vascular findings. No lymphadenopathy. Negative esophagus.  LUNG WINDOWS:  No suspicious pulmonary nodule.  OSSEOUS:  No suspicious lytic or blastic lesions.  CT ABDOMEN AND PELVIS FINDINGS  BODY WALL: No contributory findings.  Liver: No focal abnormality.  Biliary: No evidence of biliary obstruction or stone.  Pancreas: Unremarkable.  Spleen: Unremarkable.  Adrenals: Unremarkable.  Kidneys and ureters: No evidence of solid renal mass. There is a 42 mm cyst in the lower pole left kidney. Right renal cortical low densities are too small to characterize but favor cyst. No hydronephrosis.  Bladder: Unremarkable.  Reproductive: No pathologic findings.  Bowel: Negative.  Retroperitoneum: No mass or adenopathy.  Peritoneum: No ascites or pneumoperitoneum.  Vascular: No acute abnormality.  OSSEOUS:  No suspicious lytic or blastic lesions.  IMPRESSION: 1. No evidence of primary malignancy or metastatic disease in the chest or abdomen. 2. 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   Electronically Signed   By: Monte Fantasia M.D.   On: 06/29/2014 05:10   Mr [name] CV Contrast  06/29/2014   CLINICAL DATA:  64 year old female with multiple brain masses discovered on head CT for altered mental status. No primary malignancy identified recently by CT in the chest abdomen or pelvis. Initial encounter.  EXAM: MRI HEAD WITHOUT AND WITH CONTRAST  TECHNIQUE: Multiplanar, multiecho pulse sequences of the brain and surrounding structures were obtained without and with intravenous contrast.  CONTRAST:  54mL MULTIHANCE GADOBENATE DIMEGLUMINE 529 MG/ML IV SOLN  COMPARISON:  Head CT without contrast 06/28/2014.  FINDINGS: Motion artifact on post-contrast images.  Six enhancing brain masses are identified. All appear to be intra-axial, but all are  located near the surface of the brain. The largest masses in the anterior right frontal lobe and has hemorrhaged. The blood products encompass 54 x 51 x 32 mm. The lobulated enhancing component of the mass  is 45 x 32 x 41 mm. The most solid enhancing component of the mass is isointense to gray matter on T2 with diffusion restriction. Moderate to severe surrounding vasogenic edema which tracks into the genu of the corpus callosum an just across midline, without associated enhancement. Significant regional mass effect with regional midline shift up to 14 mm.  The other 5 enhancing metastases measure up to 24 mm. Of these, a lesion at the left superior frontal gyrus abutting the Fox and a lesion in the right superior cerebellum show definite internal hemorrhage, but no surrounding edema. The largest of these is located near the right sylvian fissure in the temporal stem and has both solid and cystic components.  Also, all but the left superior parasagittal lesion demonstrate intrinsic T1 hyperintensity, which for 2 lesions does not seem related to blood products.  No other blood products are identified within the brain. Mass effect on the lateral and third ventricles without ventriculomegaly. Basilar cisterns remain patent. Partially empty sella configuration. No extra-axial fluid or hemorrhage. No restricted diffusion or evidence of acute infarction. Major intracranial vascular flow voids are preserved. Mass effect on the bilateral ACAs.  Normal visualized bone marrow signal. Grossly normal visualized cervical spinal cord.  Degenerative disc herniation at C3-C4 resulting in mild spinal stenosis. Visualized paranasal sinuses and mastoids are clear. Visualized orbit soft tissues are within normal limits. Visualized scalp soft tissues are within normal limits.  IMPRESSION: 1. Six enhancing brain masses are identified. All appearing intra-axial, but are located near the surface of the brain. The largest in the anterior  right frontal lobe has hemorrhaged into the brain. Two other small lesions have bled internally without surrounding edema. Two lesions have intrinsic T1 hyperintensity which appears unrelated to blood products. 2. Top differential considerations in light of no primary tumor identified on CT chest abdomen and pelvis include: - Melanoma metastases (favored), - Other hypervascular metastasis such as thyroid (I note the thyroid is absent) or choriocarcinoma, - Multifocal high-grade glioma - CNS lymphoma (least likely). 3. Moderate to severe regional mass effect in the anterior frontal lobes, with midline shift up to 14 mm. Basilar cisterns remain patent.   Electronically Signed   By: Genevie Ann M.D.   On: 06/29/2014 12:09   Ct Abdomen Pelvis W Contrast  06/29/2014   CLINICAL DATA:  Brain mass.  EXAM: CT CHEST, ABDOMEN, AND PELVIS WITH CONTRAST  TECHNIQUE: Multidetector CT imaging of the chest, abdomen and pelvis was performed following the standard protocol during bolus administration of intravenous contrast.  CONTRAST:  182mL OMNIPAQUE IOHEXOL 300 MG/ML  SOLN  COMPARISON:  None.  FINDINGS: CT CHEST FINDINGS  THORACIC INLET/BODY WALL:  No acute abnormality.  MEDIASTINUM:  Mild cardiomegaly. There is fusiform aneurysmal enlargement of the ascending aorta to 41 mm. No acute vascular findings. No lymphadenopathy. Negative esophagus.  LUNG WINDOWS:  No suspicious pulmonary nodule.  OSSEOUS:  No suspicious lytic or blastic lesions.  CT ABDOMEN AND PELVIS FINDINGS  BODY WALL: No contributory findings.  Liver: No focal abnormality.  Biliary: No evidence of biliary obstruction or stone.  Pancreas: Unremarkable.  Spleen: Unremarkable.  Adrenals: Unremarkable.  Kidneys and ureters: No evidence of solid renal mass. There is a 42 mm cyst in the lower pole left kidney. Right renal cortical low densities are too small to characterize but favor cyst. No hydronephrosis.  Bladder: Unremarkable.  Reproductive: No pathologic findings.   Bowel: Negative.  Retroperitoneum: No mass or adenopathy.  Peritoneum: No ascites or pneumoperitoneum.  Vascular: No acute abnormality.  OSSEOUS:  No suspicious lytic or blastic lesions.  IMPRESSION: 1. No evidence of primary malignancy or metastatic disease in the chest or abdomen. 2. 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   Electronically Signed   By: Monte Fantasia M.D.   On: 06/29/2014 05:10   Dg Chest Port 1 View  07/02/2014   CLINICAL DATA:  Central line placement  EXAM: PORTABLE CHEST - 1 VIEW  COMPARISON:  None.  FINDINGS: Right IJ central line, tip at the SVC level.  There is widening of the upper mediastinum which is similar to scout from 06/29/2014 chest CT, likely combination of positioning and patient's known ascending aortic aneurysm. Chronic mild cardiomegaly. There is interstitial crowding without collapse or edema. No effusion or pneumothorax.  IMPRESSION: Right IJ central line is in good position.  No pneumothorax.   Electronically Signed   By: Monte Fantasia M.D.   On: 07/02/2014 21:12      Assessment/Plan: 64 y.o.  Metastatic Brain Lesions She was admitted with a two-week history of progressive gait instability MRI of the brain on admission, confirmed 6 enhancing brain masses CT of the chest abdomen and pelvis were negative for occult disease. She was placed on IV Decadron and Keppra She underwent stereotactic craniotomy on 07/02/2014.  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 Formal pathology report is pending. We will await for formal pathology report to proceed with him and recommendations, which may include radiation and possible chemotherapy.  The patient and her family are interested in continuing her care at the [facility] in [place] for [place].  Leukocytosis This is due to Decadron No intervention is indicated at this time Will continue to monitor  DVT prophylaxis On mechanical  devices  Full Code   Other medical issues including hypothyroidism, hyperglycemia as per admitting team    [facility] E, PA-C 07/03/2014 9:50 AM    Patient seen and examined please see full note by Georgeanne Nim PA-C. This is a pleasant woman with multifocal brain lesions that represents likely metastatic disease after presenting with gait disturbance. She status post surgical biopsy at this time and recovering well.  Imaging studies were personally reviewed today including CT scan, MRI and x-rays. Laboratory testing was really unremarkable except for mild leukocytosis.  The differential diagnosis of these brain lesions include metastatic carcinoma of unknown primary, primary CNS lymphoma or a primary brain neoplasm. The picture is more consistent with metastatic disease of unknown primary. The primary could be lung, melanoma or possibly breast.  For management standpoint, we will have to wait for the final pathology and immunohistochemical staining for better identification of this tumor. Unless we are dealing with primary CNS lymphoma I see no immediate role for systemic chemotherapy. If it's indeed metastatic carcinoma, then brain radiation may be the next step.   I will ask Dr. Tammi Klippel from radiation oncology to evaluate her imaging studies and potentially present her in tumor Board at some point.  I see most of her patient can be done as an outpatient and it would be ideal for her to have follow-up at [facility] with Dr. Whitney Muse from medical oncology standpoint.    [facility] MD 07/03/2014.

## 2014-07-03 NOTE — Progress Notes (Signed)
Subjective: Patient reports Doing well minimal headache no numbness or tingling arms or legs  Objective: Vital signs in last 24 hours: Temp:  [98.1 F (36.7 C)-98.8 F (37.1 C)] 98.3 F (36.8 C) (04/21 0400) Pulse Rate:  [47-61] 49 (04/21 0700) Resp:  [12-21] 16 (04/21 0700) BP: (131-162)/(66-91) 144/71 mmHg (04/21 0700) SpO2:  [9 %-97 %] 95 % (04/21 0700) Arterial Line BP: (93-173)/(78-111) 164/82 mmHg (04/21 0700) Weight:  [76.4 kg (168 lb 6.9 oz)] 76.4 kg (168 lb 6.9 oz) (04/21 0500)  Intake/Output from previous day: 04/20 0701 - 04/21 0700 In: 2261.3 [I.V.:2061.3; IV Piggyback:200] Out: 0814 [Urine:1425; Blood:150] Intake/Output this shift:    Awake alert oriented strength 5 out of 5 wound clean dry and intact  Lab Results: No results for input(s): WBC, HGB, HCT, PLT in the last 72 hours. BMET  Recent Labs  07/02/14 2344  NA 139  K 4.2  CL 105  CO2 26  GLUCOSE 126*  BUN 20  CREATININE 0.77  CALCIUM 9.8    Studies/Results: Dg Chest Port 1 View  07/02/2014   CLINICAL DATA:  Central line placement  EXAM: PORTABLE CHEST - 1 VIEW  COMPARISON:  None.  FINDINGS: Right IJ central line, tip at the SVC level.  There is widening of the upper mediastinum which is similar to scout from 06/29/2014 chest CT, likely combination of positioning and patient's known ascending aortic aneurysm. Chronic mild cardiomegaly. There is interstitial crowding without collapse or edema. No effusion or pneumothorax.  IMPRESSION: Right IJ central line is in good position.  No pneumothorax.   Electronically Signed   By: Monte Fantasia M.D.   On: 07/02/2014 21:12    Assessment/Plan: DC A-line DC her Foley progressive mobilization with physical therapy and DC her central line  LOS: 5 days     Kruze Atchley P 07/03/2014, 7:35 AM

## 2014-07-03 NOTE — Progress Notes (Signed)
Physical Therapy Treatment Patient Details Name: Jill Chavez MRN: [id number] DOB: 1950-09-26 Today's Date: 07/03/2014    History of Present Illness pt presents with B Frotnopartietal small lesions and large R Frotnal lesion, now post Bifrontal Crani with resection of frontal mass.      PT Comments    Pt continues to have difficulties with balance and cognition.  Pt with increased deficits when attempting cognitive task during ambulation such as simple money math.  Pt easily distracted and often needs cues to focus on tasks.  Feel pt will be able to return to home with 24hr family support.  Will continue to follow.    Follow Up Recommendations  Home health PT;Supervision/Assistance - 24 hour     Equipment Recommendations  None recommended by PT    Recommendations for Other Services       Precautions / Restrictions Precautions Precautions: Fall Restrictions Weight Bearing Restrictions: No    Mobility  Bed Mobility Overal bed mobility: Needs Assistance Bed Mobility: Supine to Sit;Sit to Supine     Supine to sit: Supervision Sit to supine: Supervision   General bed mobility comments: pt moves slowly and definite use of bed rail.    Transfers Overall transfer level: Needs assistance Equipment used: None Transfers: Sit to/from Stand Sit to Stand: Min guard         General transfer comment: pt mildly unsteady when coming to stand and friends were trying to talk to her at the same time.    Ambulation/Gait Ambulation/Gait assistance: Min assist Ambulation Distance (Feet): 150 Feet Assistive device: None Gait Pattern/deviations: Step-through pattern;Decreased stride length;Drifts right/left     General Gait Details: pt moves slowly and drifts to side when attention is focused on something other than ambulating.  Occasional stumble steps when distracted requiring MinA for balance.     Stairs            Wheelchair Mobility    Modified Rankin (Stroke Patients  Only)       Balance Overall balance assessment: Needs assistance Sitting-balance support: No upper extremity supported;Feet supported Sitting balance-Leahy Scale: Good     Standing balance support: No upper extremity supported Standing balance-Leahy Scale: Fair                      Cognition Arousal/Alertness: Awake/alert Behavior During Therapy: Flat affect Overall Cognitive Status: Impaired/Different from baseline Area of Impairment: Attention;Following commands;Safety/judgement;Awareness;Problem solving   Current Attention Level: Selective   Following Commands: Follows one step commands consistently;Follows multi-step commands inconsistently Safety/Judgement: Decreased awareness of deficits Awareness: Emergent Problem Solving: Slow processing;Difficulty sequencing;Requires verbal cues;Requires tactile cues General Comments: pt has difficulty with mobility in distracting environment.  pt needs addtional cueing for multi-step directions and for focus on task at times.      Exercises      General Comments        Pertinent Vitals/Pain Pain Assessment: No/denies pain    Home Living                      Prior Function            PT Goals (current goals can now be found in the care plan section) Acute Rehab PT Goals Patient Stated Goal: Get better.   PT Goal Formulation: With patient/family Time For Goal Achievement: 07/15/14 Potential to Achieve Goals: Good Progress towards PT goals: Progressing toward goals    Frequency  Min 3X/week    PT Plan Frequency needs  to be updated    Co-evaluation             End of Session Equipment Utilized During Treatment: Gait belt Activity Tolerance: Patient tolerated treatment well Patient left: in bed;with call bell/phone within reach;with family/visitor present     Time: 5859-2924 PT Time Calculation (min) (ACUTE ONLY): 25 min  Charges:  $Gait Training: 8-22 mins                    G CodesCatarina Hartshorn, McGrew 07/03/2014, 3:30 PM

## 2014-07-03 NOTE — Clinical Documentation Improvement (Signed)
Possible Clinical Conditions?  Brain Herniation Other Condition Cannot Clinically Determine    Supporting Information:  Risk Factors: Brain Tumor with Cerebral Edema  Diagnostics:MR BRAIN 06/29/14 IMPRESSION: 1. Six enhancing brain masses are identified. All appearing intra-axial, but are located near the surface of the brain. The largest in the anterior right frontal lobe has hemorrhaged into the brain. Two other small lesions have bled internally without surrounding edema. Two lesions have intrinsic T1 hyperintensity which appears unrelated to blood products. 2. Top differential considerations in light of no primary tumor identified on CT chest abdomen and pelvis include: - Melanoma metastases (favored), - Other hypervascular metastasis such as thyroid (I note the thyroid is absent) or choriocarcinoma, - Multifocal high-grade glioma - CNS lymphoma (least likely). 3. Moderate to severe regional mass effect in the anterior frontal lobes, with midline shift up to 14 mm. Basilar cisterns remain patent.  Treatment: Craniotomy and Resection  Thank You, Alessandra Grout, RN, BSN, CCDS,Clinical Documentation Specialist:  [phone]  [phone]=Cell - Health Information Management

## 2014-07-04 LAB — GLUCOSE, CAPILLARY
GLUCOSE-CAPILLARY: 124 mg/dL — AB (ref 70–99)
GLUCOSE-CAPILLARY: 163 mg/dL — AB (ref 70–99)
Glucose-Capillary: 146 mg/dL — ABNORMAL HIGH (ref 70–99)
Glucose-Capillary: 149 mg/dL — ABNORMAL HIGH (ref 70–99)
Glucose-Capillary: 143 mg/dL — ABNORMAL HIGH (ref 70–99)
Glucose-Capillary: 145 mg/dL — ABNORMAL HIGH (ref 70–99)

## 2014-07-04 MED ORDER — LEVETIRACETAM 500 MG PO TABS
500.0000 mg | ORAL_TABLET | Freq: Two times a day (BID) | ORAL | Status: DC
  Administered 2014-07-04 – 2014-07-05 (×2): 500 mg via ORAL
  Filled 2014-07-04 (×2): qty 1

## 2014-07-04 NOTE — Progress Notes (Signed)
Patient ID: Jill Chavez, female   DOB: 1950-11-24, 65 y.o.   MRN: [id number] BP 142/88 mmHg  Pulse 56  Temp(Src) 98.3 F (36.8 C) (Oral)  Resp 16  Ht 5\' 5"  (1.651 m)  Wt 77.8 kg (171 lb 8.3 oz)  BMI 28.54 kg/m2  SpO2 97% Alert and oriented x 4, speech is clear and fluent Perrl, full eom Symmetric facial movements, and sensation Wound is clean, dry, without signs of infection Possible discharge tomorrow or this weekend.

## 2014-07-04 NOTE — Progress Notes (Signed)
PATIENT DETAILS Name: Jill Chavez Age: 64 y.o. Sex: female Date of Birth: [birth year] Admit Date: 06/28/2014 Admitting Physician Phillips Grout, MD PCP:No PCP Per Patient  Brief Narrative: Jill Chavez is a 64 yo F with a PMH of hypothyroidism who was initially seen for evaluation of her recent difficulty balancing, change in behavior and increased falls. CT of head noted multiple brain masses and an acute hemorrhage with a 34 mm mass to the R frontal lobe. Following MRI identified 6 enhancing brain masses. Subsequent Ct of abdomen and chest revealed no primary malignancy. Pt was then admitted to the stepdown unit and started on IV Decadron.  Neurosurgery was consulted and and patient underwent stereotactic craniotomy w/ removal of the large R frontal lesion on 4/20.Oncology-Dr Alen Blew has been consulted.  Subjective: No major events overnight-complains of some pain at the operative site. Otherwise no major complaints.   Assessment/Plan: Multiple brain masses including a 34 mm R frontal lobe brain mass w/ edema and hemorrhage/hematoma: Presented w/ ataxia and hx of falls, CT of head noted multiple brain masses and an acute hemorrhage w/ 34 mm mass to R frontal lobe. Following MRI of brain showed 6 enhancing brain masses. Started on Decadron and neurosurgery consulted. Underwent Stereotactic craniotomy on 4/20.Frozen section positive for "metastatic carcinoma"-.appreciate oncology evaluation, this M.D. spoke with oncology-they will arrange for outpatient follow-up, oncology plans to discuss her case at tumor board for further recommendations. Await official biopsy results.   Suspect brain malignancy- primary CNS neoplasm vs mets w/ an unknown primary: CT head shows 4 distinct brain lesions (18 mm mass in R temporal lobe, 9 mm mass in upper right cerebellum, indistinct mass in L temporal lobe, and 34 mm mass to R frontal lobe.) CT chest and abdomen show no primary malignancy or other  metastatic disease. MRI of head showed 6 enhancing brain masses all near surface of brain. Radiology suggested top elements of differential should include melanoma, choriocarcinoma, high-grade glioma and CNS lymphoma. On exam, she has no suspicious looking moles on her skin. Per pt, last colonoscopy, mammography and Pap smear were more than 10 yrs ago. See above-Oncology consulted.   Leukocytosis: Secondary to steroids. No indication of any infection at this time. Follow  Hypothyroidism: Continue Synthroid  Mild Hyperglycemia: Likely 2/2 Decadron, continue SSI - CBGs stable.  Disposition: Remain inpatient-transferred to MedSurg unit. Home when okay with neurosurgery  Antimicrobial agents  See below  Anti-infectives    Start     Dose/Rate Route Frequency Ordered Stop   07/02/14 2300  ceFAZolin (ANCEF) IVPB 1 g/50 mL premix     1 g 100 mL/hr over 30 Minutes Intravenous Every 8 hours 07/02/14 2050 07/03/14 0659   07/02/14 1520  bacitracin 50,000 Units in sodium chloride irrigation 0.9 % 500 mL irrigation  Status:  Discontinued       As needed 07/02/14 1640 07/02/14 1840      DVT Prophylaxis: SCD's  Code Status: Full code  Family Communication Husband at bedside  Procedures: None  CONSULTS:  Neurosurgery   MEDICATIONS: Scheduled Meds: . dexamethasone  4 mg Intravenous 4 times per day   Followed by  . [START ON 07/05/2014] dexamethasone  4 mg Intravenous 3 times per day  . insulin aspart  0-20 Units Subcutaneous TID WC  . insulin aspart  0-5 Units Subcutaneous QHS  . levETIRAcetam  500 mg Intravenous Q12H  . levothyroxine  125 mcg  Oral QAC breakfast  . pantoprazole  40 mg Oral BID   Continuous Infusions:   PRN Meds:.acetaminophen **OR** acetaminophen, fentaNYL (SUBLIMAZE) injection, labetalol, ondansetron **OR** ondansetron (ZOFRAN) IV, oxyCODONE-acetaminophen, promethazine    PHYSICAL EXAM: Vital signs in last 24 hours: Filed Vitals:   07/04/14 0900 07/04/14  1000 07/04/14 1100 07/04/14 1200  BP: 129/104 123/61 129/73 136/73  Pulse: 66 61 54 55  Temp:    98.5 F (36.9 C)  TempSrc:    Oral  Resp: 21 19 18 20   Height:      Weight:      SpO2: 98% 98% 95% 95%    Weight change: 1.4 kg (3 lb 1.4 oz) Filed Weights   06/30/14 0324 07/03/14 0500 07/04/14 0500  Weight: 76.7 kg (169 lb 1.5 oz) 76.4 kg (168 lb 6.9 oz) 77.8 kg (171 lb 8.3 oz)   Body mass index is 28.54 kg/(m^2).   Gen Exam: Awake and alert with clear speech. Bandage in head-not soaked.Not in any distress Neck: Supple, No JVD.  Chest: B/L Clear. No rales. CVS: S1 S2 Regular, no murmurs.  Abdomen: soft, BS +, non tender Extremities: no edema, lower extremities warm to touch. Neurologic: Non Focal-morning all 4 extremities with good strength Speech clear  Skin: No Rash.   Wounds: N/A.    Intake/Output from previous day:  Intake/Output Summary (Last 24 hours) at 07/04/14 1310 Last data filed at 07/04/14 1000  Gross per 24 hour  Intake   1785 ml  Output   1375 ml  Net    410 ml     LAB RESULTS: CBC  Recent Labs Lab 06/28/14 2157 06/29/14 0712 06/30/14 0235  WBC 8.4 12.1* 17.8*  HGB 12.7 12.9 13.2  HCT 39.0 40.7 41.2  PLT 263 260 298  MCV 89.2 89.1 88.0  MCH 29.1 28.2 28.2  MCHC 32.6 31.7 32.0  RDW 13.6 13.7 13.8  LYMPHSABS 1.4  --   --   MONOABS 0.6  --   --   EOSABS 0.0  --   --   BASOSABS 0.0  --   --     Chemistries   Recent Labs Lab 06/28/14 2157 06/29/14 0712 06/30/14 0235 07/02/14 2344  NA 143 137 140 139  K 3.9 3.8 3.8 4.2  CL 108 102 106 105  CO2 26 24 20 26   GLUCOSE 115* 141* 164* 126*  BUN 16 12 14 20   CREATININE 0.61 0.58 0.83 0.77  CALCIUM 10.8* 10.5 10.9* 9.8    CBG:  Recent Labs Lab 07/03/14 2049 07/04/14 0734 07/04/14 0750 07/04/14 1158 07/04/14 1202  GLUCAP 144* 124* 146* 163* 149*    GFR Estimated Creatinine Clearance: 74.2 mL/min (by C-G formula based on Cr of 0.77).  Coagulation profile  Recent Labs Lab  06/30/14 0235  INR 1.14    Cardiac Enzymes  Recent Labs Lab 06/28/14 2157  TROPONINI <0.03    Invalid input(s): POCBNP No results for input(s): DDIMER in the last 72 hours. No results for input(s): HGBA1C in the last 72 hours. No results for input(s): CHOL, HDL, LDLCALC, TRIG, CHOLHDL, LDLDIRECT in the last 72 hours. No results for input(s): TSH, T4TOTAL, T3FREE, THYROIDAB in the last 72 hours.  Invalid input(s): FREET3 No results for input(s): VITAMINB12, FOLATE, FERRITIN, TIBC, IRON, RETICCTPCT in the last 72 hours. No results for input(s): LIPASE, AMYLASE in the last 72 hours.  Urine Studies No results for input(s): UHGB, CRYS in the last 72 hours.  Invalid input(s): UACOL, UAPR, USPG,  UPH, UTP, UGL, UKET, UBIL, UNIT, UROB, ULEU, UEPI, UWBC, URBC, UBAC, CAST, UCOM, BILUA  MICROBIOLOGY: Recent Results (from the past 240 hour(s))  MRSA PCR Screening     Status: None   Collection Time: 06/29/14  2:43 AM  Result Value Ref Range Status   MRSA by PCR NEGATIVE NEGATIVE Final    Comment:        The GeneXpert MRSA Assay (FDA approved for NASAL specimens only), is one component of a comprehensive MRSA colonization surveillance program. It is not intended to diagnose MRSA infection nor to guide or monitor treatment for MRSA infections.     RADIOLOGY STUDIES/RESULTS: Ct Head Wo Contrast  06/28/2014   CLINICAL DATA:  Patient not acting the same. Stuttering since Wednesday.  EXAM: CT HEAD WITHOUT CONTRAST  TECHNIQUE: Contiguous axial images were obtained from the base of the skull through the vertex without intravenous contrast.  COMPARISON:  None.  FINDINGS: Skull and Sinuses:Negative for fracture or destructive process. The mastoids, middle ears, and imaged paranasal sinuses are clear.  Orbits: No acute abnormality.  Brain: There are at least 4 hyper dense intra-axial masses, the largest in the right frontal lobe measuring 34 mm. This mass is complicated by hematoma which  extends posteriorly into the right frontal white matter, measuring 28 x 39 x 30 mm. There is also an 18 mm mass in the right temporal lobe, an indistinct mass in the left temporal lobe, and a 9 mm mass in the upper right cerebellum. Patchy vasogenic edema, maximal around the hemorrhagic lesion on the right. There is mass effect and midline shift measuring up to 13 mm. No ACA infarct. The left temporal horn is mildly dilated, but no overt hydrocephalus.  Critical Value/emergent results were called by telephone at the time of interpretation on 06/28/2014 at 10:40 pm to Dr. Tanna Furry , who verbally acknowledged these results.  IMPRESSION: 1. At least 4 brain masses, a metastatic pattern. A 34 mm mass in the right frontal lobe has undergone acute hemorrhage with a 16 cc volume hematoma. 2. Patchy vasogenic edema from #1, maximal in the right frontal lobe with subfalcine herniation and developing left lateral ventricle entrapment. 3. 13 mm calcified left parafalcine meningioma.   Electronically Signed   By: Monte Fantasia M.D.   On: 06/28/2014 22:42   Ct Chest W Contrast  06/29/2014   CLINICAL DATA:  Brain mass.  EXAM: CT CHEST, ABDOMEN, AND PELVIS WITH CONTRAST  TECHNIQUE: Multidetector CT imaging of the chest, abdomen and pelvis was performed following the standard protocol during bolus administration of intravenous contrast.  CONTRAST:  177mL OMNIPAQUE IOHEXOL 300 MG/ML  SOLN  COMPARISON:  None.  FINDINGS: CT CHEST FINDINGS  THORACIC INLET/BODY WALL:  No acute abnormality.  MEDIASTINUM:  Mild cardiomegaly. There is fusiform aneurysmal enlargement of the ascending aorta to 41 mm. No acute vascular findings. No lymphadenopathy. Negative esophagus.  LUNG WINDOWS:  No suspicious pulmonary nodule.  OSSEOUS:  No suspicious lytic or blastic lesions.  CT ABDOMEN AND PELVIS FINDINGS  BODY WALL: No contributory findings.  Liver: No focal abnormality.  Biliary: No evidence of biliary obstruction or stone.  Pancreas:  Unremarkable.  Spleen: Unremarkable.  Adrenals: Unremarkable.  Kidneys and ureters: No evidence of solid renal mass. There is a 42 mm cyst in the lower pole left kidney. Right renal cortical low densities are too small to characterize but favor cyst. No hydronephrosis.  Bladder: Unremarkable.  Reproductive: No pathologic findings.  Bowel: Negative.  Retroperitoneum: No mass  or adenopathy.  Peritoneum: No ascites or pneumoperitoneum.  Vascular: No acute abnormality.  OSSEOUS:  No suspicious lytic or blastic lesions.  IMPRESSION: 1. No evidence of primary malignancy or metastatic disease in the chest or abdomen. 2. 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   Electronically Signed   By: Monte Fantasia M.D.   On: 06/29/2014 05:10   Mr [name] OZ Contrast  07/03/2014   CLINICAL DATA:  64 year old female status post biopsy of right frontal lobe hemorrhagic tumor, preliminary pathology revealing metastatic carcinoma. Formal pathology pending. Postoperative imaging. Subsequent encounter.  EXAM: MRI HEAD WITHOUT AND WITH CONTRAST  TECHNIQUE: Multiplanar, multiecho pulse sequences of the brain and surrounding structures were obtained without and with intravenous contrast.  CONTRAST:  39mL MULTIHANCE GADOBENATE DIMEGLUMINE 529 MG/ML IV SOLN  COMPARISON:  Preoperative MRI 06/29/2014.  FINDINGS: Sequelae of right frontal craniotomy. Underlying anterior right frontal lobe resection cavity, primarily cystic with a small volume of layering hemorrhage (series 5, image 13). Minimal rim of restricted diffusion along the margins of the cavity. Some margins of the resection cavity are without enhancement. There are occasional foci of intrinsic T1 signal along the margins. There are several small areas of marginal enhancement, the most pronounced is at the posterior inferior surgical margin seen on series 10, image 72. The second most prominent areas along the superior medial margin on image 81.  Significant  reduction in the volume of hemorrhage. Anterior right frontal lobe vasogenic edema and mass effect have both mildly regressed. Leftward midline shift now 9 mm (up to 14 mm preoperatively). Stable mass effect on the right lateral ventricle. No ventriculomegaly or intraventricular hemorrhage.  Remaining 5 enhancing lesions are unchanged. I note but only 1 of these lesions (superior left parasagittal mass) was heavily calcified on the presentation CT of 06/28/2014. No new brain metastasis is identified.  No restricted diffusion or evidence of acute infarction. Major intracranial vascular flow voids are stable. No new or increasing cerebral edema or mass effect. Stable pituitary, cervicomedullary junction and visualized cervical spine. Bone marrow signal remains within normal limits. Visualized paranasal sinuses and mastoids are clear. Visualized orbit soft tissues are within normal limits. Scalp postoperative changes.  IMPRESSION: 1. Status post right anterior frontal lobe tumor resection with no adverse features. Minimal restricted diffusion and enhancement along the resection cavity with mildly decreased edema and mass effect. 2. Five other enhancing intracranial masses are stable. Of these, I note that only the left superior parasagittal lesion was heavily calcified on the presentation CT of 06/28/2014, and therefore is more likely to represent a benign meningioma rather than metastasis. 3. No new intracranial abnormality.   Electronically Signed   By: Genevie Ann M.D.   On: 07/03/2014 13:31   Mr [name] HY Contrast  06/29/2014   CLINICAL DATA:  64 year old female with multiple brain masses discovered on head CT for altered mental status. No primary malignancy identified recently by CT in the chest abdomen or pelvis. Initial encounter.  EXAM: MRI HEAD WITHOUT AND WITH CONTRAST  TECHNIQUE: Multiplanar, multiecho pulse sequences of the brain and surrounding structures were obtained without and with intravenous  contrast.  CONTRAST:  58mL MULTIHANCE GADOBENATE DIMEGLUMINE 529 MG/ML IV SOLN  COMPARISON:  Head CT without contrast 06/28/2014.  FINDINGS: Motion artifact on post-contrast images.  Six enhancing brain masses are identified. All appear to be intra-axial, but all are located near the surface of the brain. The largest masses in the anterior right frontal  lobe and has hemorrhaged. The blood products encompass 54 x 51 x 32 mm. The lobulated enhancing component of the mass is 45 x 32 x 41 mm. The most solid enhancing component of the mass is isointense to gray matter on T2 with diffusion restriction. Moderate to severe surrounding vasogenic edema which tracks into the genu of the corpus callosum an just across midline, without associated enhancement. Significant regional mass effect with regional midline shift up to 14 mm.  The other 5 enhancing metastases measure up to 24 mm. Of these, a lesion at the left superior frontal gyrus abutting the Fox and a lesion in the right superior cerebellum show definite internal hemorrhage, but no surrounding edema. The largest of these is located near the right sylvian fissure in the temporal stem and has both solid and cystic components.  Also, all but the left superior parasagittal lesion demonstrate intrinsic T1 hyperintensity, which for 2 lesions does not seem related to blood products.  No other blood products are identified within the brain. Mass effect on the lateral and third ventricles without ventriculomegaly. Basilar cisterns remain patent. Partially empty sella configuration. No extra-axial fluid or hemorrhage. No restricted diffusion or evidence of acute infarction. Major intracranial vascular flow voids are preserved. Mass effect on the bilateral ACAs.  Normal visualized bone marrow signal. Grossly normal visualized cervical spinal cord.  Degenerative disc herniation at C3-C4 resulting in mild spinal stenosis. Visualized paranasal sinuses and mastoids are clear.  Visualized orbit soft tissues are within normal limits. Visualized scalp soft tissues are within normal limits.  IMPRESSION: 1. Six enhancing brain masses are identified. All appearing intra-axial, but are located near the surface of the brain. The largest in the anterior right frontal lobe has hemorrhaged into the brain. Two other small lesions have bled internally without surrounding edema. Two lesions have intrinsic T1 hyperintensity which appears unrelated to blood products. 2. Top differential considerations in light of no primary tumor identified on CT chest abdomen and pelvis include: - Melanoma metastases (favored), - Other hypervascular metastasis such as thyroid (I note the thyroid is absent) or choriocarcinoma, - Multifocal high-grade glioma - CNS lymphoma (least likely). 3. Moderate to severe regional mass effect in the anterior frontal lobes, with midline shift up to 14 mm. Basilar cisterns remain patent.   Electronically Signed   By: Genevie Ann M.D.   On: 06/29/2014 12:09   Ct Abdomen Pelvis W Contrast  06/29/2014   CLINICAL DATA:  Brain mass.  EXAM: CT CHEST, ABDOMEN, AND PELVIS WITH CONTRAST  TECHNIQUE: Multidetector CT imaging of the chest, abdomen and pelvis was performed following the standard protocol during bolus administration of intravenous contrast.  CONTRAST:  139mL OMNIPAQUE IOHEXOL 300 MG/ML  SOLN  COMPARISON:  None.  FINDINGS: CT CHEST FINDINGS  THORACIC INLET/BODY WALL:  No acute abnormality.  MEDIASTINUM:  Mild cardiomegaly. There is fusiform aneurysmal enlargement of the ascending aorta to 41 mm. No acute vascular findings. No lymphadenopathy. Negative esophagus.  LUNG WINDOWS:  No suspicious pulmonary nodule.  OSSEOUS:  No suspicious lytic or blastic lesions.  CT ABDOMEN AND PELVIS FINDINGS  BODY WALL: No contributory findings.  Liver: No focal abnormality.  Biliary: No evidence of biliary obstruction or stone.  Pancreas: Unremarkable.  Spleen: Unremarkable.  Adrenals: Unremarkable.   Kidneys and ureters: No evidence of solid renal mass. There is a 42 mm cyst in the lower pole left kidney. Right renal cortical low densities are too small to characterize but favor cyst. No hydronephrosis.  Bladder:  Unremarkable.  Reproductive: No pathologic findings.  Bowel: Negative.  Retroperitoneum: No mass or adenopathy.  Peritoneum: No ascites or pneumoperitoneum.  Vascular: No acute abnormality.  OSSEOUS:  No suspicious lytic or blastic lesions.  IMPRESSION: 1. No evidence of primary malignancy or metastatic disease in the chest or abdomen. 2. 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   Electronically Signed   By: Monte Fantasia M.D.   On: 06/29/2014 05:10   Dg Chest Port 1 View  07/02/2014   CLINICAL DATA:  Central line placement  EXAM: PORTABLE CHEST - 1 VIEW  COMPARISON:  None.  FINDINGS: Right IJ central line, tip at the SVC level.  There is widening of the upper mediastinum which is similar to scout from 06/29/2014 chest CT, likely combination of positioning and patient's known ascending aortic aneurysm. Chronic mild cardiomegaly. There is interstitial crowding without collapse or edema. No effusion or pneumothorax.  IMPRESSION: Right IJ central line is in good position.  No pneumothorax.   Electronically Signed   By: Monte Fantasia M.D.   On: 07/02/2014 21:12    Oren Binet, MD  [facility] Pager:[phone]  If 7PM-7AM, please contact night-coverage [email or web address] Password [email or web address] 07/04/2014, 1:10 PM   LOS: 6 days

## 2014-07-04 NOTE — Evaluation (Signed)
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     Clinical Impression   This 64 yo female admitted and underwent above presents to acute OT at a min guard A level and has this at home. I am recommending continued OT at home to make sure that in her own environment she can manage safely. Acute OT will sign off.    Follow Up Recommendations  Home health OT    Equipment Recommendations  None recommended by OT       Precautions / Restrictions Precautions Precautions: Fall Restrictions Weight Bearing Restrictions: No      Mobility Bed Mobility Overal bed mobility: Independent                Transfers Overall transfer level: Needs assistance   Transfers: Sit to/from Stand Sit to Stand: Min guard                   ADL Overall ADL's : Needs assistance/impaired Eating/Feeding: Independent;Sitting   Grooming: Min guard;Standing   Upper Body Bathing: Min guard;Sitting;Standing   Lower Body Bathing: Min guard;Sit to/from stand   Upper Body Dressing : Min guard;Sitting;Standing   Lower Body Dressing: Min guard;Sit to/from stand   Toilet Transfer: Min guard;Ambulation;Comfort height toilet;Grab bars   Toileting- Clothing Manipulation and Hygiene: Min guard;Sit to/from stand   Tub/ Shower Transfer: Tub transfer;Min guard;Ambulation;Shower seat     General ADL Comments: Except for eating, pt is overall is at a min guard A level due to her swaying while ambulating mainly while she is looking around. Her husband is aware to be with her when she is getting in and out of the tub and that she needs to sit on a seat to shower. He is also aware that he needs to be with her in the kitched at least the first few times  if she is going to try and cook just to make sure there are no safety issues as well as to make sure to follow up behind her when she is taking care of the finances to make sure all is done properly.     Vision Vision Assessment?: Yes Eye Alignment: Within Functional Limits Ocular Range of Motion: Within Functional Limits Alignment/Gaze Preference: Within Defined Limits Tracking/Visual Pursuits: Able to track stimulus in all quads without difficulty Convergence: Within functional limits Visual Fields: No apparent deficits Additional Comments: No change from baseline          Pertinent Vitals/Pain Pain Assessment: No/denies pain     Hand Dominance Right   Extremity/Trunk Assessment Upper Extremity Assessment Upper Extremity Assessment: Overall WFL for tasks assessed           Communication Communication Communication: No difficulties   Cognition Arousal/Alertness: Awake/alert Behavior During Therapy: WFL for tasks assessed/performed Overall Cognitive Status: Impaired/Different from baseline                 General Comments: Pt reports she is normally a person on the move, while ambulating with me around the unit and having her look to find objects as well as her just looking at whatever caught her eye she did have some sways in balance but did not come anywhere close to falling. She was able to locate her room with one wrong direction that  she self corrected, she was able to answer and find all things/questions asked of her while she ambulated in a normal time frame. The PT from that saw her eariler (10:00am) had told her that I was coming to see her and to remember my name and she did (several hours later)              [place] expects to be discharged to:: Private residence Living Arrangements: Spouse/significant other Available Help at Discharge: Family;Available 24 hours/day Type of Home: House Home Access: Stairs to enter [employer] of Steps: 4 Entrance Stairs-Rails: Can reach both Home Layout: One level     Bathroom Shower/Tub: Tub/shower unit;Curtain Shower/tub characteristics: Architectural technologist: Standard     Home Equipment: Bedside commode          Prior Functioning/Environment Level of Independence: Independent        Comments: Does not ususally drive. Watches her grandchildren (28 and 8 after school)    OT Diagnosis: Generalized weakness   OT Problem List: Impaired balance (sitting and/or standing)   OT Treatment/Interventions:      OT Goals(Current goals can be found in the care plan section) Acute Rehab OT Goals Patient Stated Goal: home soon  OT Frequency:                End of Session Nurse Communication:  (Pt had not gotten her food tray since moving from another unit)  Activity Tolerance: Patient tolerated treatment well Patient left: in bed;with call bell/phone within reach;with bed alarm set   Time: 1031-5945 OT Time Calculation (min): 39 min Charges:  OT General Charges $OT Visit: 1 Procedure OT Evaluation $Initial OT Evaluation Tier I: 1 Procedure OT Treatments $Self Care/Home Management : 23-37 mins  Almon Register [phone] 07/04/2014, 4:09 PM

## 2014-07-04 NOTE — Progress Notes (Signed)
Pt was transferred from 64M to 4N29. Admission vital signs are stable

## 2014-07-04 NOTE — Progress Notes (Signed)
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      PT Comments    Pt continues to need cueing and A for balance and safety with mobility, especially when multitasking.  Pt very motivated to continue to work on mobility and return to home.  Will continue to follow.    Follow Up Recommendations  Home health PT;Supervision/Assistance - 24 hour     Equipment Recommendations  None recommended by PT    Recommendations for Other Services       Precautions / Restrictions Precautions Precautions: Fall Restrictions Weight Bearing Restrictions: No    Mobility  Bed Mobility Overal bed mobility: Needs Assistance Bed Mobility: Supine to Sit     Supine to sit: Supervision     General bed mobility comments: S for safety and mildly impulsive.    Transfers Overall transfer level: Needs assistance Equipment used: None Transfers: Sit to/from Stand Sit to Stand: Min guard         General transfer comment: pt mildly unsteady with definite use of hands to come to stand.    Ambulation/Gait Ambulation/Gait assistance: Min assist Ambulation Distance (Feet): 200 Feet Assistive device: None Gait Pattern/deviations: Step-through pattern;Decreased stride length;Drifts right/left     General Gait Details: pt tends to drift laterally with external distractions or when being asked to perform a cognitive task while ambulating.  pt able to follow some multistep directions, but not consistent.     Stairs            Wheelchair Mobility    Modified Rankin (Stroke Patients Only)       Balance Overall balance assessment: Needs assistance Sitting-balance support: No upper extremity supported;Feet supported Sitting balance-Leahy Scale: Good      Standing balance support: No upper extremity supported Standing balance-Leahy Scale: Fair                      Cognition Arousal/Alertness: Awake/alert Behavior During Therapy: Flat affect Overall Cognitive Status: Impaired/Different from baseline Area of Impairment: Attention;Following commands;Safety/judgement;Awareness;Problem solving   Current Attention Level: Selective   Following Commands: Follows one step commands consistently;Follows multi-step commands inconsistently Safety/Judgement: Decreased awareness of deficits Awareness: Emergent Problem Solving: Slow processing;Difficulty sequencing;Requires verbal cues;Requires tactile cues General Comments: pt has difficulty with mobility in distracting environment.  pt needs addtional cueing for multi-step directions and for focus on task at times.      Exercises      General Comments        Pertinent Vitals/Pain Pain Assessment: No/denies pain    Home Living                      Prior Function            PT Goals (current goals can now be found in the care plan section) Acute Rehab PT Goals Patient Stated Goal: Get better.   PT Goal Formulation: With patient/family Time For Goal Achievement: 07/15/14 Potential to Achieve Goals: Good Progress towards PT goals: Progressing toward goals    Frequency  Min 3X/week    PT Plan Current plan remains appropriate    Co-evaluation             End of Session Equipment Utilized During Treatment: Gait belt Activity Tolerance: Patient tolerated  treatment well Patient left: in chair;with call bell/phone within reach;with family/visitor present     Time: 0838-0900 PT Time Calculation (min) (ACUTE ONLY): 22 min  Charges:  $Gait Training: 8-22 mins                    G CodesCatarina Hartshorn, [place] 07/04/2014, 9:18 AM

## 2014-07-05 LAB — GLUCOSE, CAPILLARY: Glucose-Capillary: 179 mg/dL — ABNORMAL HIGH (ref 70–99)

## 2014-07-05 MED ORDER — OXYCODONE-ACETAMINOPHEN 5-325 MG PO TABS: 1.0000 | ORAL_TABLET | Freq: Four times a day (QID) | ORAL | Status: DC | PRN

## 2014-07-05 MED ORDER — PANTOPRAZOLE SODIUM 40 MG PO TBEC: 40.0000 mg | DELAYED_RELEASE_TABLET | Freq: Every day | ORAL | Status: DC

## 2014-07-05 MED ORDER — LEVETIRACETAM 500 MG PO TABS: 500.0000 mg | ORAL_TABLET | Freq: Two times a day (BID) | ORAL | Status: DC

## 2014-07-05 MED ORDER — ONDANSETRON HCL 4 MG PO TABS: 4.0000 mg | ORAL_TABLET | ORAL | Status: AC | PRN

## 2014-07-05 MED ORDER — DEXAMETHASONE 2 MG PO TABS: 2.0000 mg | ORAL_TABLET | Freq: Two times a day (BID) | ORAL | Status: DC

## 2014-07-05 NOTE — Care Management Note (Addendum)
CARE MANAGEMENT NOTE 07/05/2014  Patient:  Jill Chavez, Jill Chavez   Account Number:  [id number]  Date Initiated:  06/30/2014  Documentation initiated by:  Whitman Hero  Subjective/Objective Assessment:   From home with husband/family admitted with mutiple brain masses.     Action/Plan:   Return to home when medically stable. CM to f/u with disposition needs.   Anticipated DC Date:  07/07/2014   Anticipated DC Plan:  [place]  CM consult      [facility] Choice  HOME HEALTH   Choice offered to / List presented to:  C-1 Patient        [place] arranged  [place] [facility]      [facility] agency  [place]   Status of service:  Completed, signed off Medicare Important Message given?  NO (If response is "NO", the following Medicare IM given date fields will be blank) Date Medicare IM given:   Medicare IM given by:   Date Additional Medicare IM given:   Additional Medicare IM given by:    Discharge Disposition:    Per UR Regulation:  Reviewed for med. necessity/level of care/duration of stay  If discussed at Central of Stay Meetings, dates discussed:    Comments:  07/05/14    10:00   Frann Rider, RN, BSN  Met with pt and husband. She agreed with HHPT. Provided pt with a list of [place] agencies. She prefers to use [facility]. Contacted [place] at [phone], spoke with Rise Paganini, she stated that a different office does the [place]. She provided the number to call the office for the therapy, [phone]. Called the office and spoke with Anne Ng and she accepted the referral. She asked that I fax the D/C order and the demographic sheet. She has access to [employer]. Fax # [phone]. Information faxed and received confirmation.    07/03/2014 Sandi Mariscal, RN BSN [facility] CCM 1040--Pt in ICU s/p crani for tumor excision. Await postop therapy evals to determine pt's needs at d/c.  06/30/2014 _0 :[address] RN,BSN,CM [place]  (husband) [phone]

## 2014-07-05 NOTE — Progress Notes (Signed)
Patient [place] home via car with family.  DC instructions and prescriptions given to patient.  Home health was set up.  Vital signs and and assessments were stable.

## 2014-07-05 NOTE — Progress Notes (Signed)
Patient ID: Jill Chavez, female   DOB: [birth year], 64 y.o.   MRN: [id number] Vital signs are stable Motor function is intact Patient is ready for discharge Follow-up with Dr. Saintclair Halsted in about 2 weeks for suture removal Patient can remain on small dose of Decadron 2 mg twice a day and continue Keppra

## 2014-07-05 NOTE — Discharge Instructions (Signed)
Please do not drive, operate heavy machinery, perform activities at heights-till you are cleared by your primary doctor.

## 2014-07-05 NOTE — Discharge Summary (Signed)
PATIENT DETAILS Name: Jill Chavez Age: 64 y.o. Sex: female Date of Birth: 10-19-1950 MRN: [id number]. Admitting Physician: Phillips Grout, MD MLY:YTKPTWS,FKCLEXN W, MD  Admit Date: 06/28/2014 Discharge date: 07/05/2014  Recommendations for Outpatient Follow-up:  1. Biopsy report from craniotomy still pending please follow. 2. Please ensure patient has follow-up with oncology  PRIMARY DISCHARGE DIAGNOSIS:  Principal Problem:   Hemorrhage, intracranial Active Problems:   Personality change   Gait abnormality   Frontal mass of brain   Cerebral edema   Intracranial hematoma   Brain tumor      PAST MEDICAL HISTORY: Past Medical History  Diagnosis Date  . Arthritis   . Hypothyroid   . GERD (gastroesophageal reflux disease)     DISCHARGE MEDICATIONS: Current Discharge Medication List    START taking these medications   Details  dexamethasone (DECADRON) 2 MG tablet Take 1 tablet (2 mg total) by mouth 2 (two) times daily. Qty: 40 tablet, Refills: 0    levETIRAcetam (KEPPRA) 500 MG tablet Take 1 tablet (500 mg total) by mouth 2 (two) times daily. Qty: 60 tablet, Refills: 0    ondansetron (ZOFRAN) 4 MG tablet Take 1 tablet (4 mg total) by mouth every 4 (four) hours as needed for nausea or vomiting. Qty: 20 tablet, Refills: 0    oxyCODONE-acetaminophen (PERCOCET/ROXICET) 5-325 MG per tablet Take 1 tablet by mouth every 6 (six) hours as needed for severe pain. Qty: 30 tablet, Refills: 0    pantoprazole (PROTONIX) 40 MG tablet Take 1 tablet (40 mg total) by mouth daily. Qty: 30 tablet, Refills: 0      CONTINUE these medications which have NOT CHANGED   Details  acetaminophen (TYLENOL) 500 MG tablet Take 500 mg by mouth every 6 (six) hours as needed for mild pain or moderate pain.    fluticasone (FLONASE) 50 MCG/ACT nasal spray Place into both nostrils daily.    levothyroxine (SYNTHROID, LEVOTHROID) 125 MCG tablet Take 125 mcg by mouth daily before breakfast.         STOP taking these medications     diphenhydrAMINE (BENADRYL) 25 MG tablet         ALLERGIES:  No Known Allergies  BRIEF HPI:  See H&P, Labs, Consult and Test reports for all details in brief, Jill Chavez is a 64 yo F with a PMH of hypothyroidism who was initially seen for evaluation of her recent difficulty balancing, change in behavior and increased falls. CT of head noted multiple brain masses and an acute hemorrhage with a 34 mm mass to the R frontal lobe  CONSULTATIONS:   hematology/oncology and Neurosurgery  PERTINENT RADIOLOGIC STUDIES: Ct Head Wo Contrast  06/28/2014   CLINICAL DATA:  Patient not acting the same. Stuttering since Wednesday.  EXAM: CT HEAD WITHOUT CONTRAST  TECHNIQUE: Contiguous axial images were obtained from the base of the skull through the vertex without intravenous contrast.  COMPARISON:  None.  FINDINGS: Skull and Sinuses:Negative for fracture or destructive process. The mastoids, middle ears, and imaged paranasal sinuses are clear.  Orbits: No acute abnormality.  Brain: There are at least 4 hyper dense intra-axial masses, the largest in the right frontal lobe measuring 34 mm. This mass is complicated by hematoma which extends posteriorly into the right frontal white matter, measuring 28 x 39 x 30 mm. There is also an 18 mm mass in the right temporal lobe, an indistinct mass in the left temporal lobe, and a 9 mm mass in the upper right cerebellum. Patchy vasogenic  edema, maximal around the hemorrhagic lesion on the right. There is mass effect and midline shift measuring up to 13 mm. No ACA infarct. The left temporal horn is mildly dilated, but no overt hydrocephalus.  Critical Value/emergent results were called by telephone at the time of interpretation on 06/28/2014 at 10:40 pm to Dr. Tanna Furry , who verbally acknowledged these results.  IMPRESSION: 1. At least 4 brain masses, a metastatic pattern. A 34 mm mass in the right frontal lobe has undergone acute hemorrhage  with a 16 cc volume hematoma. 2. Patchy vasogenic edema from #1, maximal in the right frontal lobe with subfalcine herniation and developing left lateral ventricle entrapment. 3. 13 mm calcified left parafalcine meningioma.   Electronically Signed   By: Monte Fantasia M.D.   On: 06/28/2014 22:42   Ct Chest W Contrast  06/29/2014   CLINICAL DATA:  Brain mass.  EXAM: CT CHEST, ABDOMEN, AND PELVIS WITH CONTRAST  TECHNIQUE: Multidetector CT imaging of the chest, abdomen and pelvis was performed following the standard protocol during bolus administration of intravenous contrast.  CONTRAST:  153mL OMNIPAQUE IOHEXOL 300 MG/ML  SOLN  COMPARISON:  None.  FINDINGS: CT CHEST FINDINGS  THORACIC INLET/BODY WALL:  No acute abnormality.  MEDIASTINUM:  Mild cardiomegaly. There is fusiform aneurysmal enlargement of the ascending aorta to 41 mm. No acute vascular findings. No lymphadenopathy. Negative esophagus.  LUNG WINDOWS:  No suspicious pulmonary nodule.  OSSEOUS:  No suspicious lytic or blastic lesions.  CT ABDOMEN AND PELVIS FINDINGS  BODY WALL: No contributory findings.  Liver: No focal abnormality.  Biliary: No evidence of biliary obstruction or stone.  Pancreas: Unremarkable.  Spleen: Unremarkable.  Adrenals: Unremarkable.  Kidneys and ureters: No evidence of solid renal mass. There is a 42 mm cyst in the lower pole left kidney. Right renal cortical low densities are too small to characterize but favor cyst. No hydronephrosis.  Bladder: Unremarkable.  Reproductive: No pathologic findings.  Bowel: Negative.  Retroperitoneum: No mass or adenopathy.  Peritoneum: No ascites or pneumoperitoneum.  Vascular: No acute abnormality.  OSSEOUS:  No suspicious lytic or blastic lesions.  IMPRESSION: 1. No evidence of primary malignancy or metastatic disease in the chest or abdomen. 2. 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   Electronically Signed   By: Monte Fantasia M.D.   On: 06/29/2014 05:10   Mr  [name] GE Contrast  07/03/2014   CLINICAL DATA:  64 year old female status post biopsy of right frontal lobe hemorrhagic tumor, preliminary pathology revealing metastatic carcinoma. Formal pathology pending. Postoperative imaging. Subsequent encounter.  EXAM: MRI HEAD WITHOUT AND WITH CONTRAST  TECHNIQUE: Multiplanar, multiecho pulse sequences of the brain and surrounding structures were obtained without and with intravenous contrast.  CONTRAST:  23mL MULTIHANCE GADOBENATE DIMEGLUMINE 529 MG/ML IV SOLN  COMPARISON:  Preoperative MRI 06/29/2014.  FINDINGS: Sequelae of right frontal craniotomy. Underlying anterior right frontal lobe resection cavity, primarily cystic with a small volume of layering hemorrhage (series 5, image 13). Minimal rim of restricted diffusion along the margins of the cavity. Some margins of the resection cavity are without enhancement. There are occasional foci of intrinsic T1 signal along the margins. There are several small areas of marginal enhancement, the most pronounced is at the posterior inferior surgical margin seen on series 10, image 72. The second most prominent areas along the superior medial margin on image 81.  Significant reduction in the volume of hemorrhage. Anterior right frontal lobe vasogenic edema and mass  effect have both mildly regressed. Leftward midline shift now 9 mm (up to 14 mm preoperatively). Stable mass effect on the right lateral ventricle. No ventriculomegaly or intraventricular hemorrhage.  Remaining 5 enhancing lesions are unchanged. I note but only 1 of these lesions (superior left parasagittal mass) was heavily calcified on the presentation CT of 06/28/2014. No new brain metastasis is identified.  No restricted diffusion or evidence of acute infarction. Major intracranial vascular flow voids are stable. No new or increasing cerebral edema or mass effect. Stable pituitary, cervicomedullary junction and visualized cervical spine. Bone marrow signal remains  within normal limits. Visualized paranasal sinuses and mastoids are clear. Visualized orbit soft tissues are within normal limits. Scalp postoperative changes.  IMPRESSION: 1. Status post right anterior frontal lobe tumor resection with no adverse features. Minimal restricted diffusion and enhancement along the resection cavity with mildly decreased edema and mass effect. 2. Five other enhancing intracranial masses are stable. Of these, I note that only the left superior parasagittal lesion was heavily calcified on the presentation CT of 06/28/2014, and therefore is more likely to represent a benign meningioma rather than metastasis. 3. No new intracranial abnormality.   Electronically Signed   By: Genevie Ann M.D.   On: 07/03/2014 13:31   Mr [name] GM Contrast  06/29/2014   CLINICAL DATA:  63 year old female with multiple brain masses discovered on head CT for altered mental status. No primary malignancy identified recently by CT in the chest abdomen or pelvis. Initial encounter.  EXAM: MRI HEAD WITHOUT AND WITH CONTRAST  TECHNIQUE: Multiplanar, multiecho pulse sequences of the brain and surrounding structures were obtained without and with intravenous contrast.  CONTRAST:  24mL MULTIHANCE GADOBENATE DIMEGLUMINE 529 MG/ML IV SOLN  COMPARISON:  Head CT without contrast 06/28/2014.  FINDINGS: Motion artifact on post-contrast images.  Six enhancing brain masses are identified. All appear to be intra-axial, but all are located near the surface of the brain. The largest masses in the anterior right frontal lobe and has hemorrhaged. The blood products encompass 54 x 51 x 32 mm. The lobulated enhancing component of the mass is 45 x 32 x 41 mm. The most solid enhancing component of the mass is isointense to gray matter on T2 with diffusion restriction. Moderate to severe surrounding vasogenic edema which tracks into the genu of the corpus callosum an just across midline, without associated enhancement. Significant regional  mass effect with regional midline shift up to 14 mm.  The other 5 enhancing metastases measure up to 24 mm. Of these, a lesion at the left superior frontal gyrus abutting the Fox and a lesion in the right superior cerebellum show definite internal hemorrhage, but no surrounding edema. The largest of these is located near the right sylvian fissure in the temporal stem and has both solid and cystic components.  Also, all but the left superior parasagittal lesion demonstrate intrinsic T1 hyperintensity, which for 2 lesions does not seem related to blood products.  No other blood products are identified within the brain. Mass effect on the lateral and third ventricles without ventriculomegaly. Basilar cisterns remain patent. Partially empty sella configuration. No extra-axial fluid or hemorrhage. No restricted diffusion or evidence of acute infarction. Major intracranial vascular flow voids are preserved. Mass effect on the bilateral ACAs.  Normal visualized bone marrow signal. Grossly normal visualized cervical spinal cord.  Degenerative disc herniation at C3-C4 resulting in mild spinal stenosis. Visualized paranasal sinuses and mastoids are clear. Visualized orbit soft tissues are within normal limits.  Visualized scalp soft tissues are within normal limits.  IMPRESSION: 1. Six enhancing brain masses are identified. All appearing intra-axial, but are located near the surface of the brain. The largest in the anterior right frontal lobe has hemorrhaged into the brain. Two other small lesions have bled internally without surrounding edema. Two lesions have intrinsic T1 hyperintensity which appears unrelated to blood products. 2. Top differential considerations in light of no primary tumor identified on CT chest abdomen and pelvis include: - Melanoma metastases (favored), - Other hypervascular metastasis such as thyroid (I note the thyroid is absent) or choriocarcinoma, - Multifocal high-grade glioma - CNS lymphoma (least  likely). 3. Moderate to severe regional mass effect in the anterior frontal lobes, with midline shift up to 14 mm. Basilar cisterns remain patent.   Electronically Signed   By: Genevie Ann M.D.   On: 06/29/2014 12:09   Ct Abdomen Pelvis W Contrast  06/29/2014   CLINICAL DATA:  Brain mass.  EXAM: CT CHEST, ABDOMEN, AND PELVIS WITH CONTRAST  TECHNIQUE: Multidetector CT imaging of the chest, abdomen and pelvis was performed following the standard protocol during bolus administration of intravenous contrast.  CONTRAST:  126mL OMNIPAQUE IOHEXOL 300 MG/ML  SOLN  COMPARISON:  None.  FINDINGS: CT CHEST FINDINGS  THORACIC INLET/BODY WALL:  No acute abnormality.  MEDIASTINUM:  Mild cardiomegaly. There is fusiform aneurysmal enlargement of the ascending aorta to 41 mm. No acute vascular findings. No lymphadenopathy. Negative esophagus.  LUNG WINDOWS:  No suspicious pulmonary nodule.  OSSEOUS:  No suspicious lytic or blastic lesions.  CT ABDOMEN AND PELVIS FINDINGS  BODY WALL: No contributory findings.  Liver: No focal abnormality.  Biliary: No evidence of biliary obstruction or stone.  Pancreas: Unremarkable.  Spleen: Unremarkable.  Adrenals: Unremarkable.  Kidneys and ureters: No evidence of solid renal mass. There is a 42 mm cyst in the lower pole left kidney. Right renal cortical low densities are too small to characterize but favor cyst. No hydronephrosis.  Bladder: Unremarkable.  Reproductive: No pathologic findings.  Bowel: Negative.  Retroperitoneum: No mass or adenopathy.  Peritoneum: No ascites or pneumoperitoneum.  Vascular: No acute abnormality.  OSSEOUS:  No suspicious lytic or blastic lesions.  IMPRESSION: 1. No evidence of primary malignancy or metastatic disease in the chest or abdomen. 2. 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   Electronically Signed   By: Monte Fantasia M.D.   On: 06/29/2014 05:10   Dg Chest Port 1 View  07/02/2014   CLINICAL DATA:  Central line placement  EXAM:  PORTABLE CHEST - 1 VIEW  COMPARISON:  None.  FINDINGS: Right IJ central line, tip at the SVC level.  There is widening of the upper mediastinum which is similar to scout from 06/29/2014 chest CT, likely combination of positioning and patient's known ascending aortic aneurysm. Chronic mild cardiomegaly. There is interstitial crowding without collapse or edema. No effusion or pneumothorax.  IMPRESSION: Right IJ central line is in good position.  No pneumothorax.   Electronically Signed   By: Monte Fantasia M.D.   On: 07/02/2014 21:12     PERTINENT LAB RESULTS: CBC: No results for input(s): WBC, HGB, HCT, PLT in the last 72 hours. CMET CMP     Component Value Date/Time   NA 139 07/02/2014 2344   K 4.2 07/02/2014 2344   CL 105 07/02/2014 2344   CO2 26 07/02/2014 2344   GLUCOSE 126* 07/02/2014 2344   BUN 20 07/02/2014 2344   CREATININE  0.77 07/02/2014 2344   CALCIUM 9.8 07/02/2014 2344   PROT 7.3 06/30/2014 0235   ALBUMIN 4.0 06/30/2014 0235   AST 19 06/30/2014 0235   ALT 12 06/30/2014 0235   ALKPHOS 63 06/30/2014 0235   BILITOT 0.8 06/30/2014 0235   GFRNONAA 88* 07/02/2014 2344   GFRAA >90 07/02/2014 2344    GFR Estimated Creatinine Clearance: 74.7 mL/min (by C-G formula based on Cr of 0.77). No results for input(s): LIPASE, AMYLASE in the last 72 hours. No results for input(s): CKTOTAL, CKMB, CKMBINDEX, TROPONINI in the last 72 hours. Invalid input(s): POCBNP No results for input(s): DDIMER in the last 72 hours. No results for input(s): HGBA1C in the last 72 hours. No results for input(s): CHOL, HDL, LDLCALC, TRIG, CHOLHDL, LDLDIRECT in the last 72 hours. No results for input(s): TSH, T4TOTAL, T3FREE, THYROIDAB in the last 72 hours.  Invalid input(s): FREET3 No results for input(s): VITAMINB12, FOLATE, FERRITIN, TIBC, IRON, RETICCTPCT in the last 72 hours. Coags: No results for input(s): INR in the last 72 hours.  Invalid input(s): PT Microbiology: Recent Results (from the  past 240 hour(s))  MRSA PCR Screening     Status: None   Collection Time: 06/29/14  2:43 AM  Result Value Ref Range Status   MRSA by PCR NEGATIVE NEGATIVE Final    Comment:        The GeneXpert MRSA Assay (FDA approved for NASAL specimens only), is one component of a comprehensive MRSA colonization surveillance program. It is not intended to diagnose MRSA infection nor to guide or monitor treatment for MRSA infections.      BRIEF HOSPITAL COURSE:  Multiple brain masses including a 34 mm R frontal lobe brain mass w/ edema and hemorrhage/hematoma: Presented w/ ataxia and hx of falls, CT of head noted multiple brain masses and an acute hemorrhage w/ 34 mm mass to R frontal lobe. Following MRI of brain showed 6 enhancing brain masses. Started on Decadron and neurosurgery consulted. Underwent Stereotactic craniotomy on 4/20.Frozen section positive for "metastatic carcinoma"-.official biopsy results still pending at the time of discharge. Patient was seen by oncology during this hospital stay, plans are for outpatient workup and further treatment. This M.D. spoke with oncology (Dr Alen Blew) on 4/22-he will arrange for outpatient follow-up, oncology plans to discuss her case at tumor board for further recommendations.  have asked patient to call oncology in one week if office still has not contacted her  Suspect brain malignancy- primary CNS neoplasm vs mets w/ an unknown primary: CT head shows 4 distinct brain lesions (18 mm mass in R temporal lobe, 9 mm mass in upper right cerebellum, indistinct mass in L temporal lobe, and 34 mm mass to R frontal lobe.) CT chest and abdomen show no primary malignancy or other metastatic disease. MRI of head showed 6 enhancing brain masses all near surface of brain. Radiology suggested top elements of differential should include melanoma, choriocarcinoma, high-grade glioma and CNS lymphoma. On exam, she has no suspicious looking moles on her skin. Per pt, last  colonoscopy, mammography and Pap smear were more than 10 yrs ago. See above.   Leukocytosis: Secondary to steroids. No indication of any infection at this time. Follow  Hypothyroidism: Continue Synthroid  Mild Hyperglycemia: Likely 2/2 Decadron, follow in the outpatient setting   TODAY-DAY OF DISCHARGE:  Subjective:   Stephinie Battisti today has no headache,no chest abdominal pain,no new weakness tingling or numbness, feels much better wants to go home today.   Objective:   Blood  pressure 107/67, pulse 56, temperature 98.2 F (36.8 C), temperature source Oral, resp. rate 16, height 5\' 5"  (1.651 m), weight 78.8 kg (173 lb 11.6 oz), SpO2 96 %.  Intake/Output Summary (Last 24 hours) at 07/05/14 1045 Last data filed at 07/04/14 1700  Gross per 24 hour  Intake    240 ml  Output      0 ml  Net    240 ml   Filed Weights   07/03/14 0500 07/04/14 0500 07/05/14 0500  Weight: 76.4 kg (168 lb 6.9 oz) 77.8 kg (171 lb 8.3 oz) 78.8 kg (173 lb 11.6 oz)    Exam Awake Alert, Oriented *3, No new F.N deficits, Normal affect [place].AT,PERRAL Supple Neck,No JVD, No cervical lymphadenopathy appriciated.  Symmetrical Chest wall movement, Good air movement bilaterally, CTAB RRR,No Gallops,Rubs or new Murmurs, No Parasternal Heave +ve B.Sounds, Abd Soft, Non tender, No organomegaly appriciated, No rebound -guarding or rigidity. No Cyanosis, Clubbing or edema, No new Rash or bruise  DISCHARGE CONDITION: Stable  DISPOSITION: Home with home health services  DISCHARGE INSTRUCTIONS:    Activity:  As tolerated with Full fall precautions use walker/cane & assistance as needed Patient instructed not drive, operate heavy machinery, perform activities at heights-till cleared by PCP.   Diet recommendation: Diabetic Diet Heart Healthy diet    Discharge Instructions    Diet - low sodium heart healthy    Complete by:  As directed      Discharge instructions    Complete by:  As directed   Keep wound dry,  okay to shower-pat gently to dry. Cover with dry dressing.     Increase activity slowly    Complete by:  As directed            Follow-up Information    Follow up with CRAM,GARY P, MD. Schedule an appointment as soon as possible for a visit in 1 week.   Specialty:  Neurosurgery   Contact information:   [address] [place] [address] [phone]       Follow up with [facility], MD. Call in 1 week.   Specialty:  Oncology   Why:  please call in 1 week-if office has not contacted you.   Contact information:   [address] [place] [address] [phone]       Follow up with Haywood Pao, MD. Schedule an appointment as soon as possible for a visit in 2 weeks.   Specialty:  Internal Medicine   Contact information:   [place] [address] [phone]       Total Time spent on discharge equals 45 minutes.  SignedOren Binet 07/05/2014 10:45 AM

## 2014-07-07 ENCOUNTER — Encounter (HOSPITAL_COMMUNITY): Payer: Self-pay | Admitting: Neurosurgery

## 2014-07-14 ENCOUNTER — Other Ambulatory Visit: Payer: Self-pay | Admitting: Radiation Therapy

## 2014-07-14 DIAGNOSIS — C7931 Secondary malignant neoplasm of brain: Secondary | ICD-10-CM

## 2014-07-15 ENCOUNTER — Telehealth: Payer: Self-pay | Admitting: Oncology

## 2014-07-15 ENCOUNTER — Encounter: Payer: Self-pay | Admitting: Radiation Oncology

## 2014-07-15 NOTE — Progress Notes (Addendum)
Location/Histology of Brain Tumor: Right Frontal Lobe 34 mm mass and 6 enhancing brain masses found on MRI 06/29/14    07/02/14   Patient presented with symptoms of: in early April 2016.  She presented to the ER on 06/28/14 with personality changes and difficulty with walking.  Past or anticipated interventions, if any, per neurosurgery: Stereotactic Craniotomy Right Frontal Brain Tumor 07/02/14 Dr. Kary Kos, MD  Past or anticipated interventions, if any, per medical oncology: Has seen Dr. Alen Blew - no plans for chemotherapy  Dose of Decadron, if applicable: 2 mg bid   Recent neurologic symptoms, if any:   Seizures:none  Headaches: none  Nausea: no  Dizziness/ataxia: No  Difficulty with hand coordination: no  Focal numbness/weakness: no  Visual deficits/changes:none  Confusion/Memory deficits: none A&O x4  Painful bone metastases at present, if any: no  SAFETY ISSUES:  Prior radiation? NO  Pacemaker/ICD? {NO  Possible current pregnancy? NO  Is the patient on methotrexate? no  Additional Complaints / other details: Married, GxP3, 1 child,Frontal incsion  sutures taken out 07/15/14 , intact and with some dried blood in areas    Mother breast cancer, Sister Uterine cancer,deceased, numerous maternal cousins with breast cancer,    Allergies:NKA

## 2014-07-15 NOTE — Telephone Encounter (Signed)
Patient requested to be seen @ [facility] medical records faxed [phone]

## 2014-07-16 ENCOUNTER — Ambulatory Visit: Payer: 59 | Admitting: Radiation Oncology

## 2014-07-16 ENCOUNTER — Ambulatory Visit
Admission: RE | Admit: 2014-07-16 | Discharge: 2014-07-16 | Disposition: A | Discharge status: Home / Self Care | Payer: 59 | Source: Ambulatory Visit | Attending: Radiation Oncology | Admitting: Radiation Oncology

## 2014-07-16 ENCOUNTER — Ambulatory Visit: Payer: 59

## 2014-07-16 ENCOUNTER — Encounter: Payer: Self-pay | Admitting: Radiation Oncology

## 2014-07-16 VITALS — BP 168/84 | HR 62 | Temp 98.3°F | Resp 20 | Ht 65.0 in | Wt 169.3 lb

## 2014-07-16 DIAGNOSIS — Z510 Encounter for antineoplastic radiation therapy: Secondary | ICD-10-CM | POA: Diagnosis present

## 2014-07-16 DIAGNOSIS — C7931 Secondary malignant neoplasm of brain: Secondary | ICD-10-CM | POA: Diagnosis not present

## 2014-07-16 DIAGNOSIS — C801 Malignant (primary) neoplasm, unspecified: Secondary | ICD-10-CM | POA: Diagnosis not present

## 2014-07-16 DIAGNOSIS — E039 Hypothyroidism, unspecified: Secondary | ICD-10-CM | POA: Diagnosis not present

## 2014-07-16 DIAGNOSIS — K219 Gastro-esophageal reflux disease without esophagitis: Secondary | ICD-10-CM | POA: Insufficient documentation

## 2014-07-16 DIAGNOSIS — Z79899 Other long term (current) drug therapy: Secondary | ICD-10-CM | POA: Diagnosis not present

## 2014-07-16 HISTORY — DX: Secondary malignant neoplasm of brain: C79.31

## 2014-07-16 NOTE — Progress Notes (Signed)
Radiation Oncology         [phone]) [phone] ________________________________  Initial outpatient Consultation  Name: Jill Chavez MRN: [id number]  Date: 07/16/2014  DOB: 1950/07/21  ME:QASTMHD,[id number] W, MD  Kary Kos, MD   REFERRING PHYSICIAN: Kary Kos, MD  DIAGNOSIS:    ICD-9-CM ICD-10-CM   1. Brain metastasis 198.3 C79.31      HISTORY OF PRESENT ILLNESS::Jill Chavez is a 64 y.o. female who presented with personality changes, difficulty walking, and a bad headache. No seizures.  MRI of the brain was performed after admission into the hospital and after an abnormal CT of the head on 06/29/14. MRI revealed a 5.4cm right frontal brain mass as well as 5 other enhancing brain masses. Resection of the right frontal brain mass was done on 07/02/14 by Dr. Saintclair Halsted. Pathology revealed metastatic melanoma. Of note CT of the chest, abdomen, and pelvis on 07/09/14 was neg for metastatic disease. Post-operative MRI on 07/09/14 reveals a resection cavity in the right frontal brain and 5 other enhancing masses, 1 of which is felt to be a left superior parasagittal meningioma rather than metastasis. She has seen Dr. Alen Blew of medical oncology and may have a follow up at [facility].  After surgery, the pt has no more headaches and she walks better. Denies nausea. Pt is on Decadron (1 2mg  tab by mouth 2 times daily). Sister-in-law states that the pt's memory and personality is back to normal. The pt denies signif sunburns as a child. No hx of cancer. Never had radiation therapy. 1 more MRI scheduled on 07/21/14 at [facility] on [address].   PREVIOUS RADIATION THERAPY: No  PAST MEDICAL HISTORY:  has a past medical history of Arthritis; Hypothyroid; GERD (gastroesophageal reflux disease); and Brain metastasis.    PAST SURGICAL HISTORY: Past Surgical History  Procedure Laterality Date  . Craniotomy Right 07/02/2014    Procedure: Stereotactic Craniotomy for right frontal brain tumor excision  w/BrainLab;  Surgeon: Kary Kos, MD;  Location: [place] [facility];  Service: Neurosurgery;  Laterality: Right;  right     FAMILY HISTORY: family history is not on file.  SOCIAL HISTORY:  reports that she has never smoked. She does not have any smokeless tobacco history on file. She reports that she does not drink alcohol or use illicit drugs.  ALLERGIES: Review of patient's allergies indicates no known allergies.  MEDICATIONS:  Current Outpatient Prescriptions  Medication Sig Dispense Refill  . dexamethasone (DECADRON) 2 MG tablet Take 1 tablet (2 mg total) by mouth 2 (two) times daily. 40 tablet 0  . levETIRAcetam (KEPPRA) 500 MG tablet Take 1 tablet (500 mg total) by mouth 2 (two) times daily. 60 tablet 0  . levothyroxine (SYNTHROID, LEVOTHROID) 125 MCG tablet Take 125 mcg by mouth daily before breakfast.     . pantoprazole (PROTONIX) 40 MG tablet Take 1 tablet (40 mg total) by mouth daily. 30 tablet 0  . acetaminophen (TYLENOL) 500 MG tablet Take 500 mg by mouth every 6 (six) hours as needed for mild pain or moderate pain.    . fluticasone (FLONASE) 50 MCG/ACT nasal spray Place into both nostrils daily.    . ondansetron (ZOFRAN) 4 MG tablet Take 1 tablet (4 mg total) by mouth every 4 (four) hours as needed for nausea or vomiting. (Patient not taking: Reported on 07/16/2014) 20 tablet 0  . oxyCODONE-acetaminophen (PERCOCET/ROXICET) 5-325 MG per tablet Take 1 tablet by mouth every 6 (six) hours as needed for severe pain. (Patient not taking: Reported  on 07/16/2014) 30 tablet 0   No current facility-administered medications for this encounter.    REVIEW OF SYSTEMS:  Notable for that above.   PHYSICAL EXAM:  height is 5\' 5"  (1.651 m) and weight is 169 lb 4.8 oz (76.794 kg). Her oral temperature is 98.3 F (36.8 C). Her blood pressure is 168/84 and her pulse is 62. Her respiration is 20 and oxygen saturation is 100%.   General: Alert and oriented, in no acute distress HEENT: Head is normocephalic.  Extraocular movements are intact. Oropharynx is clear. Bifrontal surgical scar is noted with no sign of infection. Neck: Neck is supple, no palpable cervical or supraclavicular lymphadenopathy. Heart: Regular in rate and rhythm with no murmurs, rubs, or gallops. Chest: Clear to auscultation bilaterally, with no rhonchi, wheezes, or rales. Abdomen: Soft, nontender, nondistended, with no rigidity or guarding. Normal active bowel sounds. Extremities: No cyanosis or edema. Lymphatics: see Neck Exam Skin: No concerning lesions. Musculoskeletal: symmetric strength and muscle tone throughout. Neurologic: Cranial nerves II through XII are grossly intact. No obvious focalities. Speech is fluent. Coordination is intact.  Psychiatric: Judgment and insight are intact. Affect is appropriate.  KPS = 90   LABORATORY DATA:  Lab Results  Component Value Date   WBC 17.8* 06/30/2014   HGB 13.2 06/30/2014   HCT 41.2 06/30/2014   MCV 88.0 06/30/2014   PLT 298 06/30/2014   CMP     Component Value Date/Time   NA 139 07/02/2014 2344   K 4.2 07/02/2014 2344   CL 105 07/02/2014 2344   CO2 26 07/02/2014 2344   GLUCOSE 126* 07/02/2014 2344   BUN 20 07/02/2014 2344   CREATININE 0.77 07/02/2014 2344   CALCIUM 9.8 07/02/2014 2344   PROT 7.3 06/30/2014 0235   ALBUMIN 4.0 06/30/2014 0235   AST 19 06/30/2014 0235   ALT 12 06/30/2014 0235   ALKPHOS 63 06/30/2014 0235   BILITOT 0.8 06/30/2014 0235   GFRNONAA 88* 07/02/2014 2344   GFRAA >90 07/02/2014 2344         RADIOGRAPHY: Ct Head Wo Contrast  06/28/2014   CLINICAL DATA:  Patient not acting the same. Stuttering since Wednesday.  EXAM: CT HEAD WITHOUT CONTRAST  TECHNIQUE: Contiguous axial images were obtained from the base of the skull through the vertex without intravenous contrast.  COMPARISON:  None.  FINDINGS: Skull and Sinuses:Negative for fracture or destructive process. The mastoids, middle ears, and imaged paranasal sinuses are clear.  Orbits:  No acute abnormality.  Brain: There are at least 4 hyper dense intra-axial masses, the largest in the right frontal lobe measuring 34 mm. This mass is complicated by hematoma which extends posteriorly into the right frontal white matter, measuring 28 x 39 x 30 mm. There is also an 18 mm mass in the right temporal lobe, an indistinct mass in the left temporal lobe, and a 9 mm mass in the upper right cerebellum. Patchy vasogenic edema, maximal around the hemorrhagic lesion on the right. There is mass effect and midline shift measuring up to 13 mm. No ACA infarct. The left temporal horn is mildly dilated, but no overt hydrocephalus.  Critical Value/emergent results were called by telephone at the time of interpretation on 06/28/2014 at 10:40 pm to Dr. Tanna Furry , who verbally acknowledged these results.  IMPRESSION: 1. At least 4 brain masses, a metastatic pattern. A 34 mm mass in the right frontal lobe has undergone acute hemorrhage with a 16 cc volume hematoma. 2. Patchy vasogenic edema from #  1, maximal in the right frontal lobe with subfalcine herniation and developing left lateral ventricle entrapment. 3. 13 mm calcified left parafalcine meningioma.   Electronically Signed   By: Monte Fantasia M.D.   On: 06/28/2014 22:42   Ct Chest W Contrast  06/29/2014   CLINICAL DATA:  Brain mass.  EXAM: CT CHEST, ABDOMEN, AND PELVIS WITH CONTRAST  TECHNIQUE: Multidetector CT imaging of the chest, abdomen and pelvis was performed following the standard protocol during bolus administration of intravenous contrast.  CONTRAST:  15mL OMNIPAQUE IOHEXOL 300 MG/ML  SOLN  COMPARISON:  None.  FINDINGS: CT CHEST FINDINGS  THORACIC INLET/BODY WALL:  No acute abnormality.  MEDIASTINUM:  Mild cardiomegaly. There is fusiform aneurysmal enlargement of the ascending aorta to 41 mm. No acute vascular findings. No lymphadenopathy. Negative esophagus.  LUNG WINDOWS:  No suspicious pulmonary nodule.  OSSEOUS:  No suspicious lytic or blastic  lesions.  CT ABDOMEN AND PELVIS FINDINGS  BODY WALL: No contributory findings.  Liver: No focal abnormality.  Biliary: No evidence of biliary obstruction or stone.  Pancreas: Unremarkable.  Spleen: Unremarkable.  Adrenals: Unremarkable.  Kidneys and ureters: No evidence of solid renal mass. There is a 42 mm cyst in the lower pole left kidney. Right renal cortical low densities are too small to characterize but favor cyst. No hydronephrosis.  Bladder: Unremarkable.  Reproductive: No pathologic findings.  Bowel: Negative.  Retroperitoneum: No mass or adenopathy.  Peritoneum: No ascites or pneumoperitoneum.  Vascular: No acute abnormality.  OSSEOUS:  No suspicious lytic or blastic lesions.  IMPRESSION: 1. No evidence of primary malignancy or metastatic disease in the chest or abdomen. 2. 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   Electronically Signed   By: Monte Fantasia M.D.   On: 06/29/2014 05:10   Mr [name] HT Contrast  07/09/2014   ADDENDUM REPORT: 07/09/2014 09:34  ADDENDUM: This case was discussed at Multidisciplinary Tumor Conference this morning.  Final pathology on the right frontal lobe tumor reveals: METASTATIC MELANOMA.   Electronically Signed   By: Genevie Ann M.D.   On: 07/09/2014 09:34   07/09/2014   CLINICAL DATA:  64 year old female status post biopsy of right frontal lobe hemorrhagic tumor, preliminary pathology revealing metastatic carcinoma. Formal pathology pending. Postoperative imaging. Subsequent encounter.  EXAM: MRI HEAD WITHOUT AND WITH CONTRAST  TECHNIQUE: Multiplanar, multiecho pulse sequences of the brain and surrounding structures were obtained without and with intravenous contrast.  CONTRAST:  19mL MULTIHANCE GADOBENATE DIMEGLUMINE 529 MG/ML IV SOLN  COMPARISON:  Preoperative MRI 06/29/2014.  FINDINGS: Sequelae of right frontal craniotomy. Underlying anterior right frontal lobe resection cavity, primarily cystic with a small volume of layering hemorrhage  (series 5, image 13). Minimal rim of restricted diffusion along the margins of the cavity. Some margins of the resection cavity are without enhancement. There are occasional foci of intrinsic T1 signal along the margins. There are several small areas of marginal enhancement, the most pronounced is at the posterior inferior surgical margin seen on series 10, image 72. The second most prominent areas along the superior medial margin on image 81.  Significant reduction in the volume of hemorrhage. Anterior right frontal lobe vasogenic edema and mass effect have both mildly regressed. Leftward midline shift now 9 mm (up to 14 mm preoperatively). Stable mass effect on the right lateral ventricle. No ventriculomegaly or intraventricular hemorrhage.  Remaining 5 enhancing lesions are unchanged. I note but only 1 of these lesions (superior left parasagittal mass) was  heavily calcified on the presentation CT of 06/28/2014. No new brain metastasis is identified.  No restricted diffusion or evidence of acute infarction. Major intracranial vascular flow voids are stable. No new or increasing cerebral edema or mass effect. Stable pituitary, cervicomedullary junction and visualized cervical spine. Bone marrow signal remains within normal limits. Visualized paranasal sinuses and mastoids are clear. Visualized orbit soft tissues are within normal limits. Scalp postoperative changes.  IMPRESSION: 1. Status post right anterior frontal lobe tumor resection with no adverse features. Minimal restricted diffusion and enhancement along the resection cavity with mildly decreased edema and mass effect. 2. Five other enhancing intracranial masses are stable. Of these, I note that only the left superior parasagittal lesion was heavily calcified on the presentation CT of 06/28/2014, and therefore is more likely to represent a benign meningioma rather than metastasis. 3. No new intracranial abnormality.  Electronically Signed: By: Genevie Ann M.D.  On: 07/03/2014 13:31   Mr [name] ZO Contrast  06/29/2014   CLINICAL DATA:  64 year old female with multiple brain masses discovered on head CT for altered mental status. No primary malignancy identified recently by CT in the chest abdomen or pelvis. Initial encounter.  EXAM: MRI HEAD WITHOUT AND WITH CONTRAST  TECHNIQUE: Multiplanar, multiecho pulse sequences of the brain and surrounding structures were obtained without and with intravenous contrast.  CONTRAST:  51mL MULTIHANCE GADOBENATE DIMEGLUMINE 529 MG/ML IV SOLN  COMPARISON:  Head CT without contrast 06/28/2014.  FINDINGS: Motion artifact on post-contrast images.  Six enhancing brain masses are identified. All appear to be intra-axial, but all are located near the surface of the brain. The largest masses in the anterior right frontal lobe and has hemorrhaged. The blood products encompass 54 x 51 x 32 mm. The lobulated enhancing component of the mass is 45 x 32 x 41 mm. The most solid enhancing component of the mass is isointense to gray matter on T2 with diffusion restriction. Moderate to severe surrounding vasogenic edema which tracks into the genu of the corpus callosum an just across midline, without associated enhancement. Significant regional mass effect with regional midline shift up to 14 mm.  The other 5 enhancing metastases measure up to 24 mm. Of these, a lesion at the left superior frontal gyrus abutting the Fox and a lesion in the right superior cerebellum show definite internal hemorrhage, but no surrounding edema. The largest of these is located near the right sylvian fissure in the temporal stem and has both solid and cystic components.  Also, all but the left superior parasagittal lesion demonstrate intrinsic T1 hyperintensity, which for 2 lesions does not seem related to blood products.  No other blood products are identified within the brain. Mass effect on the lateral and third ventricles without ventriculomegaly. Basilar cisterns remain  patent. Partially empty sella configuration. No extra-axial fluid or hemorrhage. No restricted diffusion or evidence of acute infarction. Major intracranial vascular flow voids are preserved. Mass effect on the bilateral ACAs.  Normal visualized bone marrow signal. Grossly normal visualized cervical spinal cord.  Degenerative disc herniation at C3-C4 resulting in mild spinal stenosis. Visualized paranasal sinuses and mastoids are clear. Visualized orbit soft tissues are within normal limits. Visualized scalp soft tissues are within normal limits.  IMPRESSION: 1. Six enhancing brain masses are identified. All appearing intra-axial, but are located near the surface of the brain. The largest in the anterior right frontal lobe has hemorrhaged into the brain. Two other small lesions have bled internally without surrounding edema. Two lesions  have intrinsic T1 hyperintensity which appears unrelated to blood products. 2. Top differential considerations in light of no primary tumor identified on CT chest abdomen and pelvis include: - Melanoma metastases (favored), - Other hypervascular metastasis such as thyroid (I note the thyroid is absent) or choriocarcinoma, - Multifocal high-grade glioma - CNS lymphoma (least likely). 3. Moderate to severe regional mass effect in the anterior frontal lobes, with midline shift up to 14 mm. Basilar cisterns remain patent.   Electronically Signed   By: Genevie Ann M.D.   On: 06/29/2014 12:09   Ct Abdomen Pelvis W Contrast  06/29/2014   CLINICAL DATA:  Brain mass.  EXAM: CT CHEST, ABDOMEN, AND PELVIS WITH CONTRAST  TECHNIQUE: Multidetector CT imaging of the chest, abdomen and pelvis was performed following the standard protocol during bolus administration of intravenous contrast.  CONTRAST:  116mL OMNIPAQUE IOHEXOL 300 MG/ML  SOLN  COMPARISON:  None.  FINDINGS: CT CHEST FINDINGS  THORACIC INLET/BODY WALL:  No acute abnormality.  MEDIASTINUM:  Mild cardiomegaly. There is fusiform aneurysmal  enlargement of the ascending aorta to 41 mm. No acute vascular findings. No lymphadenopathy. Negative esophagus.  LUNG WINDOWS:  No suspicious pulmonary nodule.  OSSEOUS:  No suspicious lytic or blastic lesions.  CT ABDOMEN AND PELVIS FINDINGS  BODY WALL: No contributory findings.  Liver: No focal abnormality.  Biliary: No evidence of biliary obstruction or stone.  Pancreas: Unremarkable.  Spleen: Unremarkable.  Adrenals: Unremarkable.  Kidneys and ureters: No evidence of solid renal mass. There is a 42 mm cyst in the lower pole left kidney. Right renal cortical low densities are too small to characterize but favor cyst. No hydronephrosis.  Bladder: Unremarkable.  Reproductive: No pathologic findings.  Bowel: Negative.  Retroperitoneum: No mass or adenopathy.  Peritoneum: No ascites or pneumoperitoneum.  Vascular: No acute abnormality.  OSSEOUS:  No suspicious lytic or blastic lesions.  IMPRESSION: 1. No evidence of primary malignancy or metastatic disease in the chest or abdomen. 2. 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   Electronically Signed   By: Monte Fantasia M.D.   On: 06/29/2014 05:10   Dg Chest Port 1 View  07/02/2014   CLINICAL DATA:  Central line placement  EXAM: PORTABLE CHEST - 1 VIEW  COMPARISON:  None.  FINDINGS: Right IJ central line, tip at the SVC level.  There is widening of the upper mediastinum which is similar to scout from 06/29/2014 chest CT, likely combination of positioning and patient's known ascending aortic aneurysm. Chronic mild cardiomegaly. There is interstitial crowding without collapse or edema. No effusion or pneumothorax.  IMPRESSION: Right IJ central line is in good position.  No pneumothorax.   Electronically Signed   By: Monte Fantasia M.D.   On: 07/02/2014 21:12      IMPRESSION/PLAN: This is a very pleasant 64 year old woman with metastatic disease to the brain. She understands her disease can possibly be controlled for some time with  radiotherapy but not cured.  I had a lengthy discussion with the patient after reviewing their MRI results with them.  We spoke about whole brain radiotherapy versus stereotactic radiosurgery to the brain. We spoke about the differing risks benefits and side effects of both of these treatments. During part of our discussion, we spoke about the hair loss, fatigue and cognitive effects that can result from whole brain radiotherapy.  Additionally, we spoke about radionecrosis that can result from stereotactic radiosurgery. I explained that whole brain radiotherapy is more  comprehensive and therefore can decrease the chance of recurrences elsewhere in the brain, while stereotactic radiosurgery only treats the areas of gross disease while sparing the rest of the brain parenchyma.  After lengthy discussion, the patient would like to proceed with stereotactic brain radiosurgery to their metastatic disease as long as her 3T MRI next week doesn't show a very high number of lesions warranting whole brain radiation. The patient signed a consent form.  CT simulation will take place on 07/21/14 and treatment on 07/23/14.   To be proactive, I encouraged the pt to see a medical oncologist (followup pending) and a dermatologist (no consult ordered yet). I will refer the pt to [facility].     This document serves as a record of services personally performed by Eppie Gibson, MD. It was created on her behalf by Darcus Austin, a trained medical scribe. The creation of this record is based on the scribe's personal observations and the provider's statements to them. This document has been checked and approved by the attending provider.     __________________________________________   Eppie Gibson, MD

## 2014-07-16 NOTE — Progress Notes (Signed)
Please see the Nurse Progress Note in the MD Initial Consult Encounter for this patient. 

## 2014-07-17 NOTE — Addendum Note (Signed)
Encounter addended by: Doreen Beam, RN on: 07/17/2014  9:36 AM<BR>     Documentation filed: Charges VN

## 2014-07-18 ENCOUNTER — Telehealth: Payer: Self-pay | Admitting: *Deleted

## 2014-07-18 ENCOUNTER — Ambulatory Visit: Payer: 59 | Admitting: Radiation Oncology

## 2014-07-18 ENCOUNTER — Other Ambulatory Visit: Payer: Self-pay | Admitting: Radiation Therapy

## 2014-07-18 DIAGNOSIS — C7931 Secondary malignant neoplasm of brain: Secondary | ICD-10-CM

## 2014-07-18 NOTE — Telephone Encounter (Signed)
CALLED PATIENT TO INFORM OF APPT. WITH DR. Elvera Lennox ON 07-24-14- ARRIVAL TIME - 10:55 AM , SPOKE WITH PATIENT AND SHE IS AWARE OF THIS APPT.

## 2014-07-21 ENCOUNTER — Telehealth: Payer: Self-pay | Admitting: *Deleted

## 2014-07-21 ENCOUNTER — Inpatient Hospital Stay: Admission: RE | Admit: 2014-07-21 | Payer: 59 | Source: Ambulatory Visit

## 2014-07-21 ENCOUNTER — Ambulatory Visit
Admission: RE | Admit: 2014-07-21 | Discharge: 2014-07-21 | Disposition: A | Discharge status: Home / Self Care | Payer: 59 | Source: Ambulatory Visit | Attending: Radiation Oncology | Admitting: Radiation Oncology

## 2014-07-21 DIAGNOSIS — C7931 Secondary malignant neoplasm of brain: Secondary | ICD-10-CM

## 2014-07-21 DIAGNOSIS — Z510 Encounter for antineoplastic radiation therapy: Secondary | ICD-10-CM | POA: Diagnosis not present

## 2014-07-21 MED ORDER — GADOBENATE DIMEGLUMINE 529 MG/ML IV SOLN
15.0000 mL | Freq: Once | INTRAVENOUS | Status: AC | PRN
Start: 2014-07-21 — End: 2014-07-21
  Administered 2014-07-21: 15 mL via INTRAVENOUS

## 2014-07-21 MED ORDER — SODIUM CHLORIDE 0.9 % IJ SOLN
10.0000 mL | INTRAMUSCULAR | Status: DC | PRN
  Administered 2014-07-21: 10 mL via INTRAVENOUS

## 2014-07-21 NOTE — Addendum Note (Signed)
Encounter addended by: Jenene Slicker, RN on: 07/21/2014  1:47 PM<BR>     Documentation filed: Orders, Dx Association, Inpatient [facility]

## 2014-07-21 NOTE — Telephone Encounter (Signed)
CALLED PATIENT TO INFORM OF APPT. WITH DR. Elvera Chavez BEING MOVED TO 09-08-14- ARRIVAL TIME - 1:40 PM, LVM FOR A RETURN CALL

## 2014-07-21 NOTE — Progress Notes (Signed)
[name], RT reports that she removed the patient's left forearm 22 gauge IV. She reports the catheter was intact upon removal and that she applied a bandaid to the old IV site. [name], RT reports the patient tolerated this well.

## 2014-07-21 NOTE — Progress Notes (Addendum)
  Radiation Oncology         [phone] ________________________________  Name: Jill Chavez MRN: [id number]  Date: 07/21/2014  DOB: 1950/12/26  SIMULATION AND TREATMENT PLANNING NOTE    ICD-9-CM ICD-10-CM   1. Brain metastasis 198.3 C79.31     DIAGNOSIS:  Brain metastases  NARRATIVE:  The patient was brought to the [facility].  Identity was confirmed.  All relevant records and images related to the planned course of therapy were reviewed.  The patient freely provided informed written consent to proceed with treatment after reviewing the details related to the planned course of therapy. The consent form was witnessed and verified by the simulation staff. Intravenous access was established for contrast administration. Then, the patient was set-up in a stable reproducible supine position for radiation therapy.  A relocatable thermoplastic stereotactic head frame was fabricated for precise immobilization.  CT images were obtained.  Surface markings were placed.  The CT images were loaded into the planning software and fused with the patient's targeting MRI scan.  Then the target and avoidance structures were contoured.  Treatment planning then occurred.  The radiation prescription was entered and confirmed.  I have requested 3D planning  I have requested a DVH of the following structures: Brain stem, brain, left eye, right eye, lenses, optic chiasm, target volumes, uninvolved brain, and normal tissue.    PLAN:  The patient will receive radiosurgery (stereotactic): 24 Gy in 3 fraction to the Right frontal tumor cavity, 18 Gy in 1 fraction to the R insular tumor, and 20 Gy in 1 fraction to the L Lat temporal, R cerebellar, and L occipital tumors.  SPECIAL TREATMENT PROCEDURE NOTE:   This constitutes a special treatment procedure due to the ablative dose that will be delivered and the technical nature of treatment.  This highly technical modality of treatment ensures that the ablative  dose is centered on the patient's tumor while sparing normal tissues from excessive dose and risk of detrimental effects.   -----------------------------------  Eppie Gibson, MD

## 2014-07-23 ENCOUNTER — Ambulatory Visit
Admission: RE | Admit: 2014-07-23 | Discharge: 2014-07-23 | Disposition: A | Discharge status: Home / Self Care | Payer: 59 | Source: Ambulatory Visit | Attending: Radiation Oncology | Admitting: Radiation Oncology

## 2014-07-23 VITALS — BP 171/93 | HR 54 | Temp 98.6°F | Resp 12

## 2014-07-23 DIAGNOSIS — Z510 Encounter for antineoplastic radiation therapy: Secondary | ICD-10-CM | POA: Diagnosis not present

## 2014-07-23 DIAGNOSIS — C7931 Secondary malignant neoplasm of brain: Secondary | ICD-10-CM

## 2014-07-23 NOTE — Progress Notes (Signed)
  Radiation Oncology         [phone]) [phone] ________________________________  Stereotactic Treatment Procedure Note  Name: Jill Chavez MRN: [id number]  Date: 07/23/2014  DOB: 1950-07-24  SPECIAL TREATMENT PROCEDURE  3D TREATMENT PLANNING AND DOSIMETRY:  The patient's radiation plan was reviewed and approved by neurosurgery and radiation oncology prior to treatment.  It showed 3-dimensional radiation distributions overlaid onto the planning CT/MRI image set.  The [facility] for the target structures as well as the organs at risk were reviewed. The documentation of the 3D plan and dosimetry are filed in the radiation oncology EMR.    NARRATIVE:  Jill Chavez was brought to the TrueBeam stereotactic radiation treatment machine and placed supine on the CT couch. The head frame was applied, and the patient was set up for stereotactic radiosurgery.  Neurosurgery was present for the set-up and delivery.    SIMULATION VERIFICATION:  In the couch zero-angle position, the patient underwent Exactrac imaging using the Brainlab system with orthogonal KV images.  These were carefully aligned and repeated to confirm treatment position for each of the isocenters.  The Exactrac snap film verification was repeated at each couch angle.  SPECIAL TREATMENT PROCEDURE: Jill Chavez received stereotactic radiosurgery to the following targets: Right frontal 5mm tumor cavity target was treated using 5 Rapid Arc VMAT Beams to a prescription dose of 8 of 24 Gy (1st of 3 fractions).  ExacTrac Snap verification was performed for each couch angle.   This constitutes a special treatment procedure due to the ablative dose delivered and the technical nature of treatment.  This highly technical modality of treatment ensures that the ablative dose is centered on the patient's tumor while sparing normal tissues from excessive dose and risk of detrimental effects.  STEREOTACTIC TREATMENT MANAGEMENT:  Following delivery, the patient was  transported to nursing in stable condition and monitored for possible acute effects.  Vital signs were recorded BP 171/93 mmHg  Pulse 54  Temp(Src) 98.6 F (37 C) (Oral)  Resp 12  SpO2 100%. The patient tolerated treatment without significant acute effects, and was discharged to home in stable condition.    PLAN: Follow-up in 2 days for next fraction.  This document serves as a record of services personally performed by Eppie Gibson, MD. It was created on her behalf by Arlyce Harman, a trained medical scribe. The creation of this record is based on the scribe's personal observations and the provider's statements to them. This document has been checked and approved by the attending provider.  ________________________________   Eppie Gibson, MD

## 2014-07-23 NOTE — Progress Notes (Signed)
Pt entered nursing area follow [place] treatment.   VS assessed, noted elevated BP, but is in range to want her initial reading from subsequent days.  Pt denies headaches, vertigo, nausea or vomiting.  She is steady or her feet.  Pt discharged from nursing area to return home with her husband.

## 2014-07-25 ENCOUNTER — Encounter: Payer: Self-pay | Admitting: Radiation Oncology

## 2014-07-25 ENCOUNTER — Ambulatory Visit
Admission: RE | Admit: 2014-07-25 | Discharge: 2014-07-25 | Disposition: A | Discharge status: Home / Self Care | Payer: 59 | Source: Ambulatory Visit | Attending: Radiation Oncology | Admitting: Radiation Oncology

## 2014-07-25 VITALS — BP 180/88 | HR 57 | Temp 98.3°F | Resp 20

## 2014-07-25 DIAGNOSIS — C7931 Secondary malignant neoplasm of brain: Secondary | ICD-10-CM

## 2014-07-25 DIAGNOSIS — Z510 Encounter for antineoplastic radiation therapy: Secondary | ICD-10-CM | POA: Diagnosis not present

## 2014-07-25 MED ORDER — DEXAMETHASONE 2 MG PO TABS: 2.0000 mg | ORAL_TABLET | Freq: Two times a day (BID) | ORAL | Status: DC

## 2014-07-25 NOTE — Progress Notes (Signed)
  Radiation Oncology         [phone] ________________________________  Stereotactic Treatment Procedure Note  Name: Jill Chavez MRN: [id number]  Date: 07/25/2014  DOB: Aug 22, 1950  SPECIAL TREATMENT PROCEDURE  3D TREATMENT PLANNING AND DOSIMETRY:  The patient's radiation plan was reviewed and approved by neurosurgery and radiation oncology prior to treatment.  It showed 3-dimensional radiation distributions overlaid onto the planning CT/MRI image set.  The [facility] for the target structures as well as the organs at risk were reviewed. The documentation of the 3D plan and dosimetry are filed in the radiation oncology EMR.    NARRATIVE:  Jill Chavez was brought to the TrueBeam stereotactic radiation treatment machine and placed supine on the CT couch. The head frame was applied, and the patient was set up for stereotactic radiosurgery.  Neurosurgery was present for the set-up and delivery.    SIMULATION VERIFICATION:  In the couch zero-angle position, the patient underwent Exactrac imaging using the Brainlab system with orthogonal KV images.  These were carefully aligned and repeated to confirm treatment position for each of the isocenters.  The Exactrac snap film verification was repeated at each couch angle.  SPECIAL TREATMENT PROCEDURE: Jill Chavez received stereotactic radiosurgery to the following targets:  Right frontal 59mm tumor cavity target was treated using 5 Rapid Arc VMAT Beams to a prescription dose of 8 of 24 Gy (2nd of 3 fractions). ExacTrac Snap verification was performed for each couch angle.  PTV's 2-5 were then treated with DCA's. 4 table Jill were used with a single isocenter. PTV2 R insular 40mm received 18 Gy, PTV3 L Lat Temporal 74mm received 20 Gy, PTV4 R Cerebellar 50mm received 20 Gy, PTV5 L Occipital 72mm lesion received 20 Gy.   ExacTrac Snap verification was performed for each couch angle.    This constitutes a special treatment procedure due to the ablative  dose delivered and the technical nature of treatment.  This highly technical modality of treatment ensures that the ablative dose is centered on the patient's tumor while sparing normal tissues from excessive dose and risk of detrimental effects.  STEREOTACTIC TREATMENT MANAGEMENT:  Following delivery, the patient was transported to nursing in stable condition and monitored for possible acute effects.  Vital signs were recorded BP 180/88 mmHg  Pulse 57  Temp(Src) 98.3 F (36.8 C)  Resp 20  SpO2 100%. Patient without complaints. The patient tolerated treatment without significant acute effects, and was discharged to home in stable condition.    PLAN: Follow-up in 3 days for next fraction.  ________________________________   Eppie Gibson, MD

## 2014-07-25 NOTE — Progress Notes (Signed)
Patient  Brought to room 1 nursing s/p 2nd/3 SRS Brain, no c/opain, nausea, dizzy ness, light headed, no blurred vision,  no pain, b/p high, offered sprite, T.V.remote, Call bell within reach, after vitals shown to Dr. Isidore Moos will have patient wait  At least 1/2 hour will recheck vitals then 1:41 PM  BP 193/105 mmHg  Pulse 58  Temp(Src) 98.3 F (36.8 C)  Resp 20  SpO2 100%  Wt Readings from Last 3 Encounters:  07/21/14 169 lb (76.658 kg)  07/05/14 173 lb 11.6 oz (78.8 kg)  BP 180/88 mmHg  Pulse 57  Temp(Src) 98.3 F (36.8 C)  Resp 20  SpO2 100%  Took 2nd set vitals, much better no c/o patient okay to go home and relax,no driving, call for any unusual symptoms not normal , increased fever,vomiting, sudden vision changes Patient gave verbal understanding 2:03 PM

## 2014-07-28 ENCOUNTER — Ambulatory Visit
Admission: RE | Admit: 2014-07-28 | Discharge: 2014-07-28 | Disposition: A | Discharge status: Home / Self Care | Payer: 59 | Source: Ambulatory Visit | Attending: Radiation Oncology | Admitting: Radiation Oncology

## 2014-07-28 ENCOUNTER — Encounter: Payer: Self-pay | Admitting: Radiation Oncology

## 2014-07-28 DIAGNOSIS — Z510 Encounter for antineoplastic radiation therapy: Secondary | ICD-10-CM | POA: Diagnosis not present

## 2014-07-28 DIAGNOSIS — C7931 Secondary malignant neoplasm of brain: Secondary | ICD-10-CM

## 2014-07-28 NOTE — Progress Notes (Signed)
  Radiation Oncology [phone]) [phone] ________________________________  Stereotactic Treatment Procedure Note  Name: Jill SheltonMRN: [id number] Date: 5/13/2016DOB: 16-Dec-1950  SPECIAL TREATMENT PROCEDURE  3D TREATMENT PLANNING AND DOSIMETRY: The patient's radiation plan was reviewed and approved by neurosurgery and radiation oncology prior to treatment. It showed 3-dimensional radiation distributions overlaid onto the planning CT/MRI image set. The [facility] for the target structures as well as the organs at risk were reviewed. The documentation of the 3D plan and dosimetry are filed in the radiation oncology EMR.   NARRATIVE: Jill Chavez was brought to the TrueBeam stereotactic radiation treatment machine and placed supine on the CT couch. The head frame was applied, and the patient was set up for stereotactic radiosurgery. Neurosurgery was present for the set-up and delivery.   SIMULATION VERIFICATION: In the couch zero-angle position, the patient underwent Exactrac imaging using the Brainlab system with orthogonal KV images. These were carefully aligned and repeated to confirm treatment position for each of the isocenters. The Exactrac snap film verification was repeated at each couch angle.  SPECIAL TREATMENT PROCEDURE: Jill Chavez received stereotactic radiosurgery to the following targets:  Right frontal 73mm tumor cavity target was treated using 5 Rapid Arc VMAT Beams to a prescription dose of 8 of 24 Gy (3rd of 3 fractions). ExacTrac Snap verification was performed for each couch angle.   This constitutes a special treatment procedure due to the ablative dose delivered and the technical nature of treatment. This highly technical modality of treatment ensures that the ablative dose is centered on the patient's tumor while sparing normal tissues from excessive dose and risk of detrimental effects.  STEREOTACTIC TREATMENT MANAGEMENT:  Following delivery, the patient was transported to nursing in stable condition and monitored for possible acute effects. Vital signs were recorded  Patient without complaints. The patient tolerated treatment without significant acute effects, and was discharged to home in stable condition.   PLAN: Follow-up in one month  ------------------------------------------------  Jodelle Gross, MD, PhD

## 2014-07-29 NOTE — Progress Notes (Signed)
  Radiation Oncology         [phone] ________________________________  Name: Jill Chavez MRN: [id number]  Date: 07/28/2014  DOB: [birth year]  End of Treatment Note   ICD-9-CM ICD-10-CM    1. Brain metastasis 198.3 C79.31     DIAGNOSIS: Brain metastases - melanoma     Indication for treatment:  palliative       Radiation treatment dates:   07/23/2014, 07/25/2014, 07/28/2014  Site/Dose/Beams:   5 sites were treated in the brain with stereotactic radiosurgery.  PTV1 Right frontal 81mm tumor cavity target was treated using 5 Rapid Arc VMAT Beams to a prescription dose of 24 GY in 3 fractions.  PTV's 2-5 were DCA's. 4 table angles were used with a single isocenter: PTV2 R insular 58mm received 18 Gy,  PTV3 L Lat Temporal 31mm received 20 Gy,  PTV4 R Cerebellar 73mm received 20 Gy,  PTV5 L Occipital 1mm lesion received 20 Gy.  Beam energy:   6MV FFF photons for all beams  Narrative: The patient tolerated radiation treatment relatively well.      Plan: The patient has completed radiation treatment. The patient will return to radiation oncology clinic for routine followup in one month. I advised them to call or return sooner if they have any questions or concerns related to their recovery or treatment.  -----------------------------------  Eppie Gibson, MD

## 2014-08-01 NOTE — Procedures (Signed)
  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  I participated closely in the radiation treatment planning afterwards. The patient underwent planning CT which was fused to 3T high resolution MRI with 1 mm axial slices.  These images were fused on the planning system.  We contoured the gross target volumes and subsequently expanded this to yield the Planning Target Volume. I actively participated in the planning process.  I helped to define and review the target contours and also the contours of the optic pathway, eyes, brainstem and selected nearby organs at risk.  All the dose constraints for critical structures were reviewed and compared to AAPM Task Group 101.  The prescription dose conformity was reviewed.  I approved the plan electronically.    Accordingly, [name] was brought to the TrueBeam stereotactic radiation treatment linac and placed in the custom immobilization mask.  The patient was aligned according to the IR fiducial markers with BrainLab Exactrac, then orthogonal x-rays were used in ExacTrac with the 6DOF robotic table and the shifts were made to align the patient  [name] received stereotactic radiosurgery uneventfully.    Lesions treated:  4   Complex lesions treated:  1 (>3.5 cm, <63mm of optic path, or within the brainstem)   The detailed description of the procedure is recorded in the radiation oncology procedure note.  I was present for the duration  of the procedure.  DISPOSITION:  Following delivery, the patient was transported to nursing in stable condition and monitored for possible acute effects to be discharged to home in stable condition with follow-up in one month.  Parthena Fergeson P, MD 08/01/2014 6:13 AM

## 2014-08-01 NOTE — Addendum Note (Signed)
Encounter addended by: Kary Kos, MD on: 08/01/2014  6:15 AM<BR>     Documentation filed: Notes Section

## 2014-08-04 ENCOUNTER — Other Ambulatory Visit: Payer: Self-pay | Admitting: *Deleted

## 2014-08-04 ENCOUNTER — Encounter: Payer: Self-pay | Admitting: Radiation Therapy

## 2014-08-04 NOTE — Progress Notes (Signed)
I spoke with Wells Guiles at Dr. Windy Carina office. She will call in the [place] refill for Ms. Prunty. I let the pt know.   [name]

## 2014-08-04 NOTE — Telephone Encounter (Signed)
Called in Protonix 40 mg refill to [facility]" [phone].  Notified Pt of refill called in.  Pt also requesting a refill for the Keppra, Dr Isidore Moos refers pt to see her neurologist or neurosurgery to manage this refill.  Pt notified, she will contact them for further refills.

## 2014-08-08 ENCOUNTER — Telehealth: Payer: Self-pay | Admitting: Oncology

## 2014-08-08 ENCOUNTER — Encounter: Payer: Self-pay | Admitting: Radiation Therapy

## 2014-08-08 NOTE — Addendum Note (Signed)
Encounter addended by: Eppie Gibson, MD on: 08/08/2014  7:55 AM<BR>     Documentation filed: Notes Section

## 2014-08-08 NOTE — Progress Notes (Signed)
Jill Chavez was seen as an IP by Dr. Alen Blew in Med Onc and follow-up was requested after discharge. She has completed her SRS with Dr. Isidore Moos and recovering well from her surgery. When asked if she would prefer to be seen at [facility] for Med Onc follow-up, the patient said that she would like to keep things in [place] all at the same place. I have reached out to Tiffany to get her back in to see Dr. Alen Blew and let the patient know to be expecting that call.   [name]

## 2014-08-08 NOTE — Telephone Encounter (Signed)
hosp f/u- s/w patient and gave np appt for 06/15 @ 12 w/Dr. Alen Blew

## 2014-08-18 ENCOUNTER — Telehealth: Payer: Self-pay | Admitting: *Deleted

## 2014-08-18 ENCOUNTER — Ambulatory Visit
Admission: RE | Admit: 2014-08-18 | Discharge: 2014-08-18 | Disposition: A | Discharge status: Home / Self Care | Payer: 59 | Source: Ambulatory Visit | Attending: Radiation Oncology | Admitting: Radiation Oncology

## 2014-08-18 ENCOUNTER — Encounter: Payer: Self-pay | Admitting: Radiation Oncology

## 2014-08-18 VITALS — BP 184/106 | HR 72 | Temp 98.5°F | Resp 20 | Ht 65.0 in | Wt 180.3 lb

## 2014-08-18 DIAGNOSIS — C7931 Secondary malignant neoplasm of brain: Secondary | ICD-10-CM

## 2014-08-18 HISTORY — DX: Personal history of irradiation: Z92.3

## 2014-08-18 MED ORDER — DEXAMETHASONE 2 MG PO TABS: 2.0000 mg | ORAL_TABLET | Freq: Two times a day (BID) | ORAL | Status: DC

## 2014-08-18 NOTE — Progress Notes (Signed)
Radiation Oncology         ([phone] ________________________________  Name: Jill Chavez MRN: [id number]  Date: 08/18/2014  DOB: [birth year]  Follow-Up Visit Note  Outpatient  CC: Haywood Pao, MD  Drenda Freeze, MD  Diagnosis and Prior Radiotherapy:    ICD-9-CM ICD-10-CM   1. Brain metastasis 198.3 C79.31      Radiation treatment dates:   07/23/2014, 07/25/2014, 07/28/2014  Site/Dose/Beams:   5 sites were treated in the brain with stereotactic radiosurgery.  PTV1 Right frontal 61mm tumor cavity target was treated using 5 Rapid Arc VMAT Beams to a prescription dose of 24 GY in 3 fractions.  PTV's 2-5 were DCA's. 4 table angles were used with a single isocenter: PTV2 R insular 9mm received 18 Gy,  PTV3 L Lat Temporal 67mm received 20 Gy,  PTV4 R Cerebellar 68mm received 20 Gy,  PTV5 L Occipital 25mm lesion received 20 Gy  Narrative:  The patient returns today for routine follow-up.  Doing well. No seizures, pain, HA, nor nausea.  No new neuro complaints. Sees med/onc soon as well as derm.  She is on Dexamethasone 2mg  daily.                              ALLERGIES:  has No Known Allergies.  Meds: Current Outpatient Prescriptions  Medication Sig Dispense Refill  . dexamethasone (DECADRON) 2 MG tablet Take 1 tablet (2 mg total) by mouth 2 (two) times daily. Taper to 1 tab daily on May 30. 90 tablet 0  . levETIRAcetam (KEPPRA) 500 MG tablet Take 1 tablet (500 mg total) by mouth 2 (two) times daily. 60 tablet 0  . levothyroxine (SYNTHROID, LEVOTHROID) 125 MCG tablet Take 125 mcg by mouth daily before breakfast.     . pantoprazole (PROTONIX) 40 MG tablet Take 1 tablet (40 mg total) by mouth daily. 30 tablet 0  . acetaminophen (TYLENOL) 500 MG tablet Take 500 mg by mouth every 6 (six) hours as needed for mild pain or moderate pain.    . fluticasone (FLONASE) 50 MCG/ACT nasal spray Place into both nostrils daily.    . ondansetron (ZOFRAN) 4 MG tablet Take 1 tablet (4 mg total)  by mouth every 4 (four) hours as needed for nausea or vomiting. (Patient not taking: Reported on 07/16/2014) 20 tablet 0  . oxyCODONE-acetaminophen (PERCOCET/ROXICET) 5-325 MG per tablet Take 1 tablet by mouth every 6 (six) hours as needed for severe pain. (Patient not taking: Reported on 07/16/2014) 30 tablet 0   No current facility-administered medications for this encounter.    Physical Findings: The patient is in no acute distress. Patient is alert and oriented.  height is 5\' 5"  (1.651 m) and weight is 180 lb 4.8 oz (81.784 kg). Her oral temperature is 98.5 F (36.9 C). Her blood pressure is 184/106 and her pulse is 72. Her respiration is 20. [name] Kitchen    Neurologic exam unremarkable. Scalp without irritation. No oral thrush. No ankle edema KPS 90   Lab Findings: Lab Results  Component Value Date   WBC 17.8* 06/30/2014   HGB 13.2 06/30/2014   HCT 41.2 06/30/2014   MCV 88.0 06/30/2014   PLT 298 06/30/2014    Radiographic Findings: Mr [name] TT Contrast  07/21/2014   CLINICAL DATA:  Metastatic melanoma. Previous biopsy of right frontal lesion. S RS targeting.  EXAM: MRI HEAD WITHOUT AND WITH CONTRAST  TECHNIQUE: Multiplanar, multiecho pulse sequences of the brain and  surrounding structures were obtained without and with intravenous contrast.  CONTRAST:  68mL MULTIHANCE GADOBENATE DIMEGLUMINE 529 MG/ML IV SOLN  COMPARISON:  07/03/2014.  06/29/2014.  FINDINGS: Since the previous study, there is much less edema and mass effect in the region of the surgery. Right-to-left midline shift measures only 1-2 mm presently. The postoperative space is smaller, measuring only about 2-1/2 cm presently.  No new brain lesions are seen compared to the previous study. Right cerebellar metastasis measuring 10 mm is again present without significant edema or mass effect. Right insular metastasis again noted measuring 25 by I 21 mm. Left lateral temporal metastasis is unchanged measuring 10 x 15 mm. Left occipital  metastasis is unchanged measuring 4 mm. In the region of the right frontal resection, enhancement along the margins is present as expected. There is a 4 mm focus of enhancement along the superior medial age which appears more intense and focal and could represent some residual tumor.  No hydrocephalus.  No extra-axial fluid collection.  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  IMPRESSION: Since the previous study, there is much less edema in the right frontal region related to the surgery, with resolution of left-to-right shift. Postoperative cavity now measures maximally 2.5 cm.  No new brain lesions identified. Metastases in the right cerebellum, right insula, left lateral temporal lobe and left occipital lobe.  Marginal enhancement at the resection site in the right frontal region as expected. More focal area of enhancement measuring 4 mm at the medial superior margin which could represent some tumor along the margin of the resection.   Electronically Signed   By: Nelson Chimes M.D.   On: 07/21/2014 10:12    Impression/Plan:   Doing well status post SRS. Tape dexamethasone to 2mg  QOD for 2 more weeks, stop on 6/20.  Will arrange MRI of brain in 2 month and follow-up with Dr. Saintclair Halsted thereafter.   Repeatedly high blood pressure - advised follow-up with PCP to manage.   _____________________________________   Eppie Gibson, MD

## 2014-08-18 NOTE — Telephone Encounter (Signed)
Called patient home phone left voice message  For call back ,did leave message since patient's blood pressures have been high consistently here at our office numerous times,  Dr. Isidore Moos would like you to ,please contact your Primary Md about this, any questions please feel free to call us at [phone], thank you 10:24 AM

## 2014-08-18 NOTE — Progress Notes (Signed)
Follow up s/p radiation 07/23/14; 07/25/14; 07/28/14 Brain, no c/o  Pain, headaches or nausea, appetite good energy level good, takes 2mg  decadron  Total (1/2 tab), daily,  Walks to mail box daily, no c/o high b/p 8:39 AM BP 184/106 mmHg  Pulse 72  Temp(Src) 98.5 F (36.9 C) (Oral)  Resp 20  Ht 5\' 5"  (1.651 m)  Wt 180 lb 4.8 oz (81.784 kg)  BMI 30.00 kg/m2  Wt Readings from Last 3 Encounters:  08/18/14 180 lb 4.8 oz (81.784 kg)  07/21/14 169 lb (76.658 kg)  07/05/14 173 lb 11.6 oz (78.8 kg)

## 2014-08-20 ENCOUNTER — Other Ambulatory Visit: Payer: Self-pay | Admitting: Radiation Therapy

## 2014-08-20 DIAGNOSIS — C7931 Secondary malignant neoplasm of brain: Secondary | ICD-10-CM

## 2014-08-26 ENCOUNTER — Telehealth: Payer: Self-pay | Admitting: Oncology

## 2014-08-26 NOTE — Telephone Encounter (Signed)
appt confirmed 06/14 @ 12 w/Dr. Alen Blew with directions

## 2014-08-27 ENCOUNTER — Telehealth: Payer: Self-pay | Admitting: Oncology

## 2014-08-27 ENCOUNTER — Other Ambulatory Visit: Payer: 59

## 2014-08-27 ENCOUNTER — Ambulatory Visit: Payer: 59

## 2014-08-27 ENCOUNTER — Encounter: Payer: Self-pay | Admitting: Oncology

## 2014-08-27 ENCOUNTER — Ambulatory Visit (HOSPITAL_BASED_OUTPATIENT_CLINIC_OR_DEPARTMENT_OTHER): Payer: 59 | Admitting: Oncology

## 2014-08-27 VITALS — BP 175/98 | HR 74 | Temp 98.5°F | Resp 19 | Ht 65.0 in | Wt 182.4 lb

## 2014-08-27 DIAGNOSIS — C439 Malignant melanoma of skin, unspecified: Secondary | ICD-10-CM

## 2014-08-27 DIAGNOSIS — C7931 Secondary malignant neoplasm of brain: Secondary | ICD-10-CM

## 2014-08-27 NOTE — Telephone Encounter (Signed)
S/w pt confirming labs moved to same day as PET scan and MRI per pt's request and mailed out schedule.... KJ

## 2014-08-27 NOTE — Progress Notes (Signed)
Checked in new pt with no financial concerns prior to seeing the dr.  Pt has my card for any billing questions or concerns. ° °

## 2014-08-27 NOTE — Progress Notes (Signed)
Hematology and Oncology Follow Up Visit  Jill Chavez [id number] 1951/03/09 64 y.o. 08/27/2014 12:32 PM   Principle Diagnosis: 64 year old woman diagnosed with metastatic melanoma after presenting with large frontal lobe brain lesion in April 2016. She has no primary tumor identified and no evidence of other systemic metastasis.   Prior Therapy: She is status post craniotomy and resection of a right frontal hemorrhagic mass done on 07/02/2014. The pathology revealed a metastatic melanoma. She is status post SRS to 5 other sites of brain metastasis completed on 07/23/2014, 07/25/2014 and 07/28/2014.   Current therapy: Under evaluation for systemic therapy versus observation and surveillance.  Interim History: Jill Chavez presents today for a follow-up visit. She is a pleasant woman I saw in consultation after presenting with difficulty walking, balance and MRI of the brain showed multiple brain metastasis with bilateral frontoparietal small lesions and a large right frontal lesion was vasogenic edema. She underwent craniotomy and resection as mentioned and completed SRS to the brain in May 2016. Since that time, she has been doing relatively well. She has not reported any neurological deficits. Her gait and mobility have been normal. She does not report any headaches, blurry vision or seizures. Her performance status is improving.  She does not report any fevers, chills, sweats or weight loss. She does not report any chest pain, palpitation orthopnea. She does not report any cough or hemoptysis or hematemesis. She does not report any nausea, vomiting, abdominal pain. She does not report any hematochezia or melon a. She does not report any frequency, urgency or hesitancy. She does not report any skeletal complaints. Remaining review of systems unremarkable.  Medications: I have reviewed the patient's current medications.  Current Outpatient Prescriptions  Medication Sig Dispense Refill  .  acetaminophen (TYLENOL) 500 MG tablet Take 500 mg by mouth every 6 (six) hours as needed for mild pain or moderate pain.    [name] Kitchen dexamethasone (DECADRON) 2 MG tablet Take 1 tablet (2 mg total) by mouth 2 (two) times daily. Taper to 1 tab daily on May 30. Take 1 tab QOD through 09/01/14, then stop. 90 tablet 0  . levETIRAcetam (KEPPRA) 500 MG tablet Take 1 tablet (500 mg total) by mouth 2 (two) times daily. 60 tablet 0  . levothyroxine (SYNTHROID, LEVOTHROID) 125 MCG tablet Take 125 mcg by mouth daily before breakfast.     . pantoprazole (PROTONIX) 40 MG tablet Take 1 tablet (40 mg total) by mouth daily. 30 tablet 0  . ondansetron (ZOFRAN) 4 MG tablet Take 1 tablet (4 mg total) by mouth every 4 (four) hours as needed for nausea or vomiting. (Patient not taking: Reported on 08/27/2014) 20 tablet 0  . oxyCODONE-acetaminophen (PERCOCET/ROXICET) 5-325 MG per tablet Take 1 tablet by mouth every 6 (six) hours as needed for severe pain. (Patient not taking: Reported on 07/16/2014) 30 tablet 0   No current facility-administered medications for this visit.     Allergies: No Known Allergies  Past Medical History, Surgical history, Social history, and Family History were reviewed and updated.   Physical Exam: Blood pressure 175/98, pulse 74, temperature 98.5 F (36.9 C), temperature source Oral, resp. rate 19, height 5' 5"  (1.651 m), weight 182 lb 6.4 oz (82.736 kg), SpO2 98 %. ECOG: 0 General appearance: alert and cooperative cushingoid features noted. Head: Normocephalic, without obvious abnormality Neck: no adenopathy.  Lymph nodes: Cervical, supraclavicular, and axillary nodes normal. Heart:regular rate and rhythm, S1, S2 normal, no murmur, click, rub or gallop Lung:chest clear, no wheezing, rales,  normal symmetric air entry Abdomin: soft, non-tender, without masses or organomegaly EXT:no erythema, induration, or nodules Skin: No lesions noted.  Neurological: No deficits noted.  Lab Results: Lab  Results  Component Value Date   WBC 17.8* 06/30/2014   HGB 13.2 06/30/2014   HCT 41.2 06/30/2014   MCV 88.0 06/30/2014   PLT 298 06/30/2014     Chemistry      Component Value Date/Time   NA 139 07/02/2014 2344   K 4.2 07/02/2014 2344   CL 105 07/02/2014 2344   CO2 26 07/02/2014 2344   BUN 20 07/02/2014 2344   CREATININE 0.77 07/02/2014 2344      Component Value Date/Time   CALCIUM 9.8 07/02/2014 2344   ALKPHOS 63 06/30/2014 0235   AST 19 06/30/2014 0235   ALT 12 06/30/2014 0235   BILITOT 0.8 06/30/2014 0235       Radiological Studies:  IMPRESSION: Since the previous study, there is much less edema in the right frontal region related to the surgery, with resolution of left-to-right shift. Postoperative cavity now measures maximally 2.5 cm.  No new brain lesions identified. Metastases in the right cerebellum, right insula, left lateral temporal lobe and left occipital lobe.  Marginal enhancement at the resection site in the right frontal region as expected. More focal area of enhancement measuring 4 mm at the medial superior margin which could represent some tumor along the margin of the resection.  EXAM: CT CHEST, ABDOMEN, AND PELVIS WITH CONTRAST  TECHNIQUE: Multidetector CT imaging of the chest, abdomen and pelvis was performed following the standard protocol during bolus administration of intravenous contrast.  CONTRAST: 142m OMNIPAQUE IOHEXOL 300 MG/ML SOLN  COMPARISON: None.  FINDINGS: CT CHEST FINDINGS  THORACIC INLET/BODY WALL:  No acute abnormality.  MEDIASTINUM:  Mild cardiomegaly. There is fusiform aneurysmal enlargement of the ascending aorta to 41 mm. No acute vascular findings. No lymphadenopathy. Negative esophagus.  LUNG WINDOWS:  No suspicious pulmonary nodule.  OSSEOUS:  No suspicious lytic or blastic lesions.  CT ABDOMEN AND PELVIS FINDINGS  BODY WALL: No contributory findings.  Liver: No focal  abnormality.  Biliary: No evidence of biliary obstruction or stone.  Pancreas: Unremarkable.  Spleen: Unremarkable.  Adrenals: Unremarkable.  Kidneys and ureters: No evidence of solid renal mass. There is a 42 mm cyst in the lower pole left kidney. Right renal cortical low densities are too small to characterize but favor cyst. No hydronephrosis.  Bladder: Unremarkable.  Reproductive: No pathologic findings.  Bowel: Negative.  Retroperitoneum: No mass or adenopathy.  Peritoneum: No ascites or pneumoperitoneum.  Vascular: No acute abnormality.  OSSEOUS:  No suspicious lytic or blastic lesions.  IMPRESSION: 1. No evidence of primary malignancy or metastatic disease in the chest or abdomen. 2. 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     Impression and Plan:  64year old woman with the following issues:  1. Metastatic melanoma presenting with a brain metastasis but no other systemic lesions. Her CT scan chest abdomen and pelvis obtained on 06/29/2014 did not show any measurable disease. She is scheduled to be evaluated by dermatology for a potential primary.  The natural course of this disease was discussed with the patient and explained to her that a primary sometimes cannot be identified and we'll have to treat her systemically regardless of a primary. Certainly of a primary does exist, she will require wide excision and lymph node dissection potentially.  From a systemic management, I will repeat imaging studies with a  PET CT scan to evaluate for any systemic disease. If she does have systemic disease then systemic treatment will be utilized. Her initial specimen did not have BRAF testing which we will request at this time. Systemic therapy will be dictated by her mutation status. That will be in the form of immune therapy or targeted therapy.  I will schedule her PET images with her repeat MRI in the near future.  2. Brain  metastasis: She is status post surgical resection followed by [facility]. She has a repeat MRI scheduled and a follow-up with Dr. Saintclair Halsted in the near future. She continues to be on antiseizure medication and tapering doses of cellulitis.  3. Dermatology surveillance: Shows some muscular dermatology near future to establish a primary if one does exist.  4. Follow-up: Will be after PET imaging to discuss these results.     [facility], MD 6/15/201612:32 PM

## 2014-08-27 NOTE — Telephone Encounter (Signed)
Gave and printed appt sched and avs for pt for Aug °

## 2014-08-28 ENCOUNTER — Other Ambulatory Visit (HOSPITAL_COMMUNITY)
Admission: RE | Admit: 2014-08-28 | Discharge: 2014-08-28 | Disposition: A | Discharge status: Home / Self Care | Payer: 59 | Source: Ambulatory Visit | Attending: Oncology | Admitting: Oncology

## 2014-08-28 DIAGNOSIS — C439 Malignant melanoma of skin, unspecified: Secondary | ICD-10-CM | POA: Insufficient documentation

## 2014-08-28 DIAGNOSIS — C7931 Secondary malignant neoplasm of brain: Secondary | ICD-10-CM | POA: Diagnosis present

## 2014-09-04 ENCOUNTER — Encounter (HOSPITAL_COMMUNITY): Payer: Self-pay

## 2014-10-21 ENCOUNTER — Ambulatory Visit (HOSPITAL_COMMUNITY): Payer: 59

## 2014-10-21 ENCOUNTER — Ambulatory Visit (HOSPITAL_COMMUNITY)
Admission: RE | Admit: 2014-10-21 | Discharge: 2014-10-21 | Disposition: A | Discharge status: Home / Self Care | Payer: 59 | Source: Ambulatory Visit | Attending: Oncology | Admitting: Oncology

## 2014-10-21 ENCOUNTER — Other Ambulatory Visit (HOSPITAL_BASED_OUTPATIENT_CLINIC_OR_DEPARTMENT_OTHER): Payer: 59

## 2014-10-21 DIAGNOSIS — C7931 Secondary malignant neoplasm of brain: Secondary | ICD-10-CM

## 2014-10-21 DIAGNOSIS — I712 Thoracic aortic aneurysm, without rupture: Secondary | ICD-10-CM | POA: Insufficient documentation

## 2014-10-21 DIAGNOSIS — C439 Malignant melanoma of skin, unspecified: Secondary | ICD-10-CM

## 2014-10-21 LAB — CBC WITH DIFFERENTIAL/PLATELET
BASO%: 0.7 % (ref 0.0–2.0)
BASOS ABS: 0 10*3/uL (ref 0.0–0.1)
EOS%: 5.1 % (ref 0.0–7.0)
Eosinophils Absolute: 0.4 10*3/uL (ref 0.0–0.5)
HCT: 37.6 % (ref 34.8–46.6)
HEMOGLOBIN: 12.4 g/dL (ref 11.6–15.9)
LYMPH%: 32.2 % (ref 14.0–49.7)
MCH: 28.7 pg (ref 25.1–34.0)
MCHC: 33 g/dL (ref 31.5–36.0)
MCV: 87 fL (ref 79.5–101.0)
MONO#: 0.5 10*3/uL (ref 0.1–0.9)
MONO%: 6.9 % (ref 0.0–14.0)
NEUT%: 55.1 % (ref 38.4–76.8)
NEUTROS ABS: 3.8 10*3/uL (ref 1.5–6.5)
PLATELETS: 230 10*3/uL (ref 145–400)
RBC: 4.32 10*6/uL (ref 3.70–5.45)
RDW: 14 % (ref 11.2–14.5)
WBC: 6.9 10*3/uL (ref 3.9–10.3)
lymph#: 2.2 10*3/uL (ref 0.9–3.3)

## 2014-10-21 LAB — COMPREHENSIVE METABOLIC PANEL (CC13)
ALT: 8 U/L (ref 0–55)
AST: 12 U/L (ref 5–34)
Albumin: 4 g/dL (ref 3.5–5.0)
Alkaline Phosphatase: 72 U/L (ref 40–150)
Anion Gap: 7 mEq/L (ref 3–11)
BUN: 7.9 mg/dL (ref 7.0–26.0)
CHLORIDE: 110 meq/L — AB (ref 98–109)
CO2: 27 mEq/L (ref 22–29)
Calcium: 10.8 mg/dL — ABNORMAL HIGH (ref 8.4–10.4)
Creatinine: 0.7 mg/dL (ref 0.6–1.1)
Glucose: 95 mg/dl (ref 70–140)
Potassium: 3.7 mEq/L (ref 3.5–5.1)
Sodium: 145 mEq/L (ref 136–145)
TOTAL PROTEIN: 7 g/dL (ref 6.4–8.3)
Total Bilirubin: 0.85 mg/dL (ref 0.20–1.20)

## 2014-10-21 LAB — LACTATE DEHYDROGENASE (CC13): LDH: 186 U/L (ref 125–245)

## 2014-10-21 MED ORDER — FLUDEOXYGLUCOSE F - 18 (FDG) INJECTION
9.0100 | Freq: Once | INTRAVENOUS | Status: DC | PRN
  Administered 2014-10-21: 9.01 via INTRAVENOUS
  Filled 2014-10-21: qty 9.01

## 2014-10-22 LAB — GLUCOSE, CAPILLARY: GLUCOSE-CAPILLARY: 101 mg/dL — AB (ref 65–99)

## 2014-10-31 ENCOUNTER — Other Ambulatory Visit: Payer: 59

## 2014-10-31 ENCOUNTER — Encounter: Payer: Self-pay | Admitting: Radiation Oncology

## 2014-10-31 ENCOUNTER — Ambulatory Visit (HOSPITAL_COMMUNITY)
Admission: RE | Admit: 2014-10-31 | Discharge: 2014-10-31 | Disposition: A | Discharge status: Home / Self Care | Payer: 59 | Source: Ambulatory Visit | Attending: Radiation Oncology | Admitting: Radiation Oncology

## 2014-10-31 DIAGNOSIS — L989 Disorder of the skin and subcutaneous tissue, unspecified: Secondary | ICD-10-CM | POA: Insufficient documentation

## 2014-10-31 DIAGNOSIS — C7931 Secondary malignant neoplasm of brain: Secondary | ICD-10-CM | POA: Diagnosis present

## 2014-10-31 DIAGNOSIS — C439 Malignant melanoma of skin, unspecified: Secondary | ICD-10-CM | POA: Diagnosis not present

## 2014-10-31 DIAGNOSIS — Z923 Personal history of irradiation: Secondary | ICD-10-CM | POA: Insufficient documentation

## 2014-10-31 MED ORDER — GADOBENATE DIMEGLUMINE 529 MG/ML IV SOLN
15.0000 mL | Freq: Once | INTRAVENOUS | Status: AC
  Administered 2014-10-31: 15 mL via INTRAVENOUS

## 2014-10-31 NOTE — Progress Notes (Signed)
Could not reach pt by phone; reached daughter, however, this evening, to discuss MRI of brain.  Relayed that there is swelling in the brain and this may be related to post operative blood collection or tumor.  To daughter's knowledge, pt is doing relatively well, relatively stable status.  Let daughter know we will review scan at tumor board and relay information to Dr Saintclair Halsted before his f/u with her next week. She appreciated my call.   -----------------------------------  Eppie Gibson, MD

## 2014-11-03 ENCOUNTER — Encounter: Payer: Self-pay | Admitting: Radiation Therapy

## 2014-11-03 NOTE — Progress Notes (Addendum)
Braden's brain MRI from 8/19 and PET from 8/9 were discussed this morning during the [facility] Brain/Spine Conference. Doctors Isidore Moos and Gilman were both present for her review.      The MRI scan demonstrates interval hemorrhage into her resection cavity suggesting residual or recurrent tumor in this location. While reviewing her scans the recent PET was also reviewed. The PET did include her skull due to the melanoma protocol. Thankfully, there was no activity in the area of her surgical resection. This made the team lean more toward this not being progression in this site, but hemorrhage. There was however a scalp lesion seen on the MRI that was also hot on the PET. Both areas will require a short interval follow-up.         The decision was to rescan in 6 weeks and follow-up with Dr. Saintclair Halsted. In the interim, Dr. Alen Blew and Dr. Saintclair Halsted are both scheduled to see Jill Chavez on 8/23 for follow-up. Dr. Isidore Moos has asked that her scalp lesion be examined and that localized treatment can be considered if this becomes symptomatic. This could include surgical resection by Dr. Saintclair Halsted or radiation if necessary.           [name]  Special Procedures Navigator

## 2014-11-04 ENCOUNTER — Telehealth: Payer: Self-pay | Admitting: *Deleted

## 2014-11-04 ENCOUNTER — Ambulatory Visit (HOSPITAL_BASED_OUTPATIENT_CLINIC_OR_DEPARTMENT_OTHER): Payer: 59 | Admitting: Oncology

## 2014-11-04 ENCOUNTER — Telehealth: Payer: Self-pay | Admitting: Oncology

## 2014-11-04 VITALS — BP 176/83 | HR 69 | Temp 98.5°F | Resp 18 | Ht 65.0 in | Wt 175.0 lb

## 2014-11-04 DIAGNOSIS — C439 Malignant melanoma of skin, unspecified: Secondary | ICD-10-CM

## 2014-11-04 DIAGNOSIS — C7931 Secondary malignant neoplasm of brain: Secondary | ICD-10-CM | POA: Diagnosis not present

## 2014-11-04 DIAGNOSIS — L989 Disorder of the skin and subcutaneous tissue, unspecified: Secondary | ICD-10-CM

## 2014-11-04 DIAGNOSIS — D496 Neoplasm of unspecified behavior of brain: Secondary | ICD-10-CM

## 2014-11-04 NOTE — Telephone Encounter (Signed)
SPOKE TO PT. DR.SHADAD'S NURSE, STACEY CAMP,RN, HAS CALLED PT. DR.SHADAD DOES NOT WANT TO START PT. ON THE CHEMO PILL SO THERE IS NO PRESCRIPTION TO PICK UP AT THE PHARMACY.

## 2014-11-04 NOTE — Progress Notes (Signed)
Hematology and Oncology Follow Up Visit  Jill Chavez [id number] 08-29-1950 64 y.o. 11/04/2014 9:48 AM   Principle Diagnosis: 64 year old woman diagnosed with metastatic melanoma after presenting with large frontal lobe brain lesion in April 2016. She has no primary tumor identified and no evidence of other systemic metastasis. 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   Prior Therapy: She is status post craniotomy and resection of a right frontal hemorrhagic mass done on 07/02/2014. The pathology revealed a metastatic melanoma. She is status post SRS to 5 other sites of brain metastasis completed on 07/23/2014, 07/25/2014 and 07/28/2014.   Current therapy: Under evaluation for systemic therapy versus observation and surveillance.  Interim History: Jill Chavez presents today for a follow-up visit. Since the last visit, she continues to do well. She has been completely tapered off dexamethasone which affected her appetite. She still able to eat and keep food down. She has not reported any headaches, blurry vision or syncope or seizures. Her gait and mobility have been normal. Her performance status is back to baseline. She has reported some weight loss. She has reported slightly enlarging scalp lesion.  She does not report any fevers, chills, sweats. She does not report any chest pain, palpitation orthopnea. She does not report any cough or hemoptysis or hematemesis. She does not report any nausea, vomiting, abdominal pain. She does not report any hematochezia or melon a. She does not report any frequency, urgency or hesitancy. She does not report any skeletal complaints. Remaining review of systems unremarkable.  Medications: I have reviewed the patient's current medications.  Current Outpatient Prescriptions  Medication Sig Dispense Refill  . levETIRAcetam (KEPPRA) 500 MG tablet Take 1 tablet (500 mg total) by mouth 2 (two) times daily. 60 tablet 0  .  levothyroxine (SYNTHROID, LEVOTHROID) 125 MCG tablet Take 125 mcg by mouth daily before breakfast.     . ondansetron (ZOFRAN) 4 MG tablet Take 1 tablet (4 mg total) by mouth every 4 (four) hours as needed for nausea or vomiting. (Patient not taking: Reported on 08/27/2014) 20 tablet 0  . oxyCODONE-acetaminophen (PERCOCET/ROXICET) 5-325 MG per tablet Take 1 tablet by mouth every 6 (six) hours as needed for severe pain. (Patient not taking: Reported on 07/16/2014) 30 tablet 0   No current facility-administered medications for this visit.     Allergies: No Known Allergies  Past Medical History, Surgical history, Social history, and Family History were reviewed and updated.   Physical Exam: Blood pressure 176/83, pulse 69, temperature 98.5 F (36.9 C), temperature source Oral, resp. rate 18, height 5' 5"  (1.651 m), weight 175 lb (79.379 kg), SpO2 98 %. ECOG: 0 General appearance: alert and cooperative not in any distress. Head: Normocephalic, without obvious abnormality Neck: no adenopathy.  Lymph nodes: Cervical, supraclavicular, and axillary nodes normal. Heart:regular rate and rhythm, S1, S2 normal, no murmur, click, rub or gallop Lung:chest clear, no wheezing, rales, normal symmetric air entry Abdomin: soft, non-tender, without masses or organomegaly EXT:no erythema, induration, or nodules Skin: Examination of her scalp showed very small lesion on the left side around the incision site. A small bump noted also on the right side of her temple area. Neurological: No deficits noted.  Lab Results: Lab Results  Component Value Date   WBC 6.9 10/21/2014   HGB 12.4 10/21/2014   HCT 37.6 10/21/2014   MCV 87.0 10/21/2014   PLT 230 10/21/2014     Chemistry      Component Value Date/Time  NA 145 10/21/2014 0818   NA 139 07/02/2014 2344   K 3.7 10/21/2014 0818   K 4.2 07/02/2014 2344   CL 105 07/02/2014 2344   CO2 27 10/21/2014 0818   CO2 26 07/02/2014 2344   BUN 7.9 10/21/2014 0818    BUN 20 07/02/2014 2344   CREATININE 0.7 10/21/2014 0818   CREATININE 0.77 07/02/2014 2344      Component Value Date/Time   CALCIUM 10.8* 10/21/2014 0818   CALCIUM 9.8 07/02/2014 2344   ALKPHOS 72 10/21/2014 0818   ALKPHOS 63 06/30/2014 0235   AST 12 10/21/2014 0818   AST 19 06/30/2014 0235   ALT 8 10/21/2014 0818   ALT 12 06/30/2014 0235   BILITOT 0.85 10/21/2014 0818   BILITOT 0.8 06/30/2014 0235      EXAM: MRI HEAD WITHOUT AND WITH CONTRAST  TECHNIQUE: Multiplanar, multiecho pulse sequences of the brain and surrounding structures were obtained without and with intravenous contrast.  CONTRAST: 49m MULTIHANCE GADOBENATE DIMEGLUMINE 529 MG/ML IV SOLN  COMPARISON: 07/21/2014  FINDINGS: A mildly expanded, partially empty sella is again seen. There is no acute infarct.  Sequelae of right frontal craniotomy are again identified. Within the right frontal lobe subjacent to the craniotomy is a hemorrhagic cystic lesion/collection which measures 5.0 x 3.5 x 3.2 cm and is larger than the resection cavity present on the prior study. There is a separate hemorrhagic collection more inferiorly in the right frontal lobe which measures 2.8 x 2.0 cm. These 2 lesions/ cavities abut each other and may communicate. There is a 7 mm solidly enhancing, nodular focus along the anterior aspect of the more inferior cavity (series 11, image 74). There is also a 9 mm peripherally enhancing hemorrhagic focus more superiorly in the right frontal lobe just deep to the craniotomy (series 11, image 107).  Vasogenic edema in the right frontal lobe has greatly increased and involves the white matter tracts surrounding the basal ganglia and extends into the right temporal lobe. There is new mass effect on the ventricles with partial effacement of the right lateral ventricle. Leftward midline shift has significantly increased and now measures 11 mm. Mild dural thickening and enhancement  subjacent to the craniotomy may be postoperative.  Enhancing right insular metastasis has mildly increased in size, measuring 2.5 cm (series 11, image 65, previously 2.1 cm). 15 x 9 mm enhancing left temporal lobe lesion has not significantly changed in size, with slightly increased surrounding vasogenic edema which is mild in degree. 11 mm right cerebellar lesion is ring-enhancing and has not significantly changed in size (series 11, image 44). 4 mm enhancing left occipital lesion is unchanged (series 11, image 52).  13 mm extra-axial enhancing lesion at the skull just left of midline is unchanged (series 11, image 135). There is a new 13 mm enhancing lesion in the left frontal scalp (series 11, image 115) with susceptibility artifact suggestive of hemorrhage.  Orbits are unremarkable. Paranasal sinuses are clear. Trace left mastoid fluid. Major intracranial vascular flow voids are preserved.  IMPRESSION: 1. Increased size of peripherally enhancing cavities/lesions in the right frontal lobe suggestive of interval hemorrhage into the patient's resection cavity and/or hemorrhage into residual/recurrent tumor in this location. Associated vasogenic edema has greatly increased, now with 11 mm of midline shift. 2. Slightly increased size of right insular metastasis. 3. Unchanged size of left temporal, left occipital, and right cerebellar metastases. 4. Unchanged parasagittal lesion at the left parietal vertex compatible with a meningioma. 5. New 13 mm left  frontal scalp soft tissue lesion concerning for metastasis. These results were called by telephone at the time of interpretation on 10/31/2014 at 5:10 pm to Dr. Tyler Pita, who verbally acknowledged these results.   CLINICAL DATA: Subsequent treatment strategy for metastatic melanoma. 64 year old female with multifocal brain metastases initially diagnosed in April 2016, status post craniotomy and resection of the  dominant right frontal metastasis, status post stereotactic radiosurgery to the other brain metastases in May 2016, now presenting for systemic staging.  EXAM: NUCLEAR MEDICINE PET WHOLE BODY  TECHNIQUE: 9.0 mCi F-18 FDG was injected intravenously. Full-ring PET imaging was performed from the vertex to the feet after the radiotracer. CT data was obtained and used for attenuation correction and anatomic localization.  FASTING BLOOD GLUCOSE: Value: 101 mg/dl  COMPARISON: None.  FINDINGS: Head/Neck: There is a 1.0 x 0.7 cm hypermetabolic left frontal scalp mass involving the dermis and subcutaneous soft tissues with max SUV 9.3 (series 4/ image 24), new compared to 06/28/14 head CT. Postsurgical changes from right frontal craniotomy and right frontal lobe resection. The right frontal lobe resection cavity is non FDG avid. No intracranial foci of increased FDG avidity. There is nonspecific parenchymal hyperdensity at the sites of the previously identified and treated brain metastases in the bitemporal and right cerebellar regions, without appreciable mass effect or increased FDG uptake. No ventriculomegaly or midline shift. Stable 13 mm left parafalcine calcified meningioma, which is non FDG avid. There is a mildly FDG avid 1.0 x 0.4 cm mass between the anterior aspect of the deep lobe of the left parotid gland (series 4/image 46) and posterior left mandible, with max SUV 3.5, favor a hypermetabolic left level II node. No additional hypermetabolic lymph nodes in the neck. Status post thyroidectomy.  Chest: No hypermetabolic mediastinal or hilar nodes. No suspicious pulmonary nodules on the CT scan. Stable 4.1 cm ascending thoracic aortic aneurysm. There is atherosclerosis of the thoracic aorta, the great vessels of the mediastinum and the coronary arteries, including calcified atherosclerotic plaque in the left anterior descending coronary arteries. Mild cardiomegaly.  Mild hypoventilatory changes in the dependent lungs. No acute consolidative airspace disease.  Abdomen/Pelvis: No abnormal hypermetabolic activity within the liver, pancreas, adrenal glands, or spleen. No hypermetabolic lymph nodes in the abdomen or pelvis. Simple 4.5 cm lateral left lower renal cyst. Exophytic 0.8 cm hypodense lateral right renal lesion, too small to characterize, non FDG avid. There is an FDG avid focus in the posterior lower right gluteal cleft, without anatomic correlate, favor contamination.  Skeleton: No focal hypermetabolic activity to suggest skeletal metastasis.  Extremities: No hypermetabolic activity to suggest metastasis. Mild FDG avid focus at the left palm without anatomic correlate, favor contamination.  IMPRESSION: 1. New hypermetabolic 1.0 cm left frontal scalp mass, in keeping with metastasis. 2. Mildly hypermetabolic 1.0 cm mass between the posterior left mandible and deep lobe of the left parotid gland, favor a left level 2 node, compatible with nodal metastasis. 3. No additional hypermetabolic metastatic disease. 4. Expected postsurgical changes in the brain from right frontal resection and radiation treatment, with no FDG avid intracranial masses. 5. Stable 4.1 cm ascending thoracic aortic aneurysm.   Electronically Signed  By: Ilona Sorrel M.D.  On: 10/21/2014 13:24           Vitals     Height Weight BMI (Calculated)    5' 5"  (1.651 m) 175 lb (79.379 kg) 29.2      Interpretation Summary     CLINICAL DATA: Subsequent treatment strategy  for metastatic melanoma. 64 year old female with multifocal brain metastases initially diagnosed in April 2016, status post craniotomy and resection of the dominant right frontal metastasis, status post stereotactic radiosurgery to the other brain metastases in May 2016, now presenting for systemic staging.  EXAM: NUCLEAR MEDICINE PET WHOLE BODY  TECHNIQUE: 9.0 mCi  F-18 FDG was injected intravenously. Full-ring PET imaging was performed from the vertex to the feet after the radiotracer. CT data was obtained and used for attenuation correction and anatomic localization.  FASTING BLOOD GLUCOSE: Value: 101 mg/dl  COMPARISON: None.  FINDINGS: Head/Neck: There is a 1.0 x 0.7 cm hypermetabolic left frontal scalp mass involving the dermis and subcutaneous soft tissues with max SUV 9.3 (series 4/ image 24), new compared to 06/28/14 head CT. Postsurgical changes from right frontal craniotomy and right frontal lobe resection. The right frontal lobe resection cavity is non FDG avid. No intracranial foci of increased FDG avidity. There is nonspecific parenchymal hyperdensity at the sites of the previously identified and treated brain metastases in the bitemporal and right cerebellar regions, without appreciable mass effect or increased FDG uptake. No ventriculomegaly or midline shift. Stable 13 mm left parafalcine calcified meningioma, which is non FDG avid. There is a mildly FDG avid 1.0 x 0.4 cm mass between the anterior aspect of the deep lobe of the left parotid gland (series 4/image 46) and posterior left mandible, with max SUV 3.5, favor a hypermetabolic left level II node. No additional hypermetabolic lymph nodes in the neck. Status post thyroidectomy.  Chest: No hypermetabolic mediastinal or hilar nodes. No suspicious pulmonary nodules on the CT scan. Stable 4.1 cm ascending thoracic aortic aneurysm. There is atherosclerosis of the thoracic aorta, the great vessels of the mediastinum and the coronary arteries, including calcified atherosclerotic plaque in the left anterior descending coronary arteries. Mild cardiomegaly. Mild hypoventilatory changes in the dependent lungs. No acute consolidative airspace disease.  Abdomen/Pelvis: No abnormal hypermetabolic activity within the liver, pancreas, adrenal glands, or spleen. No hypermetabolic  lymph nodes in the abdomen or pelvis. Simple 4.5 cm lateral left lower renal cyst. Exophytic 0.8 cm hypodense lateral right renal lesion, too small to characterize, non FDG avid. There is an FDG avid focus in the posterior lower right gluteal cleft, without anatomic correlate, favor contamination.  Skeleton: No focal hypermetabolic activity to suggest skeletal metastasis.  Extremities: No hypermetabolic activity to suggest metastasis. Mild FDG avid focus at the left palm without anatomic correlate, favor contamination.  IMPRESSION: 1. New hypermetabolic 1.0 cm left frontal scalp mass, in keeping with metastasis. 2. Mildly hypermetabolic 1.0 cm mass between the posterior left mandible and deep lobe of the left parotid gland, favor a left level 2 node, compatible with nodal metastasis. 3. No additional hypermetabolic metastatic disease. 4. Expected postsurgical changes in the brain from right frontal resection and radiation treatment, with no FDG avid intracranial masses. 5. Stable 4.1 cm ascending thoracic aortic aneurysm.      Impression and Plan:  64 year old woman with the following issues:  1. Metastatic melanoma presenting with a brain metastasis with her initial CT scan chest abdomen and pelvis obtained on 06/29/2014 did not show any measurable disease. Her tumor was found to be BRAF mutated  PET CT scan on 10/21/2014 was discussed with the patient today. No clear-cut wide spread systemic disease noted. She has few areas of concern including a 1.0 cm mass in the scalp which is palpable today although not very symptomatic. She also has a small left parotid gland or lymph  node that could be questionable.  Risks and benefits of systemic therapy was reviewed today. Given the fact that her tumor is BRAF mutated, she can benefit from a potent inhibitor in the form of Vemuafenib or Dabrafenib. Complications associated with these medications were discussed including  dermatological complications, arthralgias, myalgias, diarrhea, nausea among others.  Given the of the bulk of her disease is rather limited we have elected to defer systemic therapy in the immediate future. However, these tumors are rather aggressive and she will require systemic therapy in the near future if her tumor continues to progress.  2. Brain metastasis: She is status post surgical resection followed by [facility].   Her recent MRI was obtained on 10/31/2014 was reviewed. Her case was also discussed in the tumor board multidisciplinary conference. It appears for the most part that her disease has not progressed intracranially and rather indicates slight hemorrhage.  She is following up with Dr. Saintclair Halsted regarding this issue. Interval scan will probably be arranged for in the near future.  3. The scalp lesion: A very small in nature and asymptomatic. Discontinue treated locally with resection or radiation. Although if systemic therapies initiated down the line, these can respond as well.  4. Dermatology surveillance: She is currently following up with dermatology a regular basis.  5. Follow-up: Will be in 6 weeks to follow-up on her clinically. If she has more signs or symptoms of progression, we will initiate systemic therapy.   [facility], MD 8/23/20169:48 AM

## 2014-11-04 NOTE — Telephone Encounter (Signed)
Received call from patient stating," Dr. Alen Blew was going to start me on a chemo pill. My boy has gone to the pharmacy, in [place], and there isn't anything there for me." Per Dr. Alen Blew, he is not starting her on an oral chemotherapy pill. I informed patient that he is not starting her on a chemotherapy pill. Patient stated,"oh, I thought he was. We discussed something this morning." Instructed patient not to worry, there is no prescription to pick up. Patient verbalized understanding.

## 2014-11-04 NOTE — Telephone Encounter (Signed)
Gave and printed papt sched and avs for pt for OCT °

## 2014-11-06 ENCOUNTER — Other Ambulatory Visit: Payer: Self-pay | Admitting: Radiation Therapy

## 2014-11-06 DIAGNOSIS — C7931 Secondary malignant neoplasm of brain: Secondary | ICD-10-CM

## 2014-11-07 ENCOUNTER — Telehealth: Payer: Self-pay | Admitting: *Deleted

## 2014-11-07 NOTE — Telephone Encounter (Signed)
PT.'S DAUGHTER WAS GIVEN RADIATION ONCOLOGY PHONE NUMBER.

## 2014-11-10 ENCOUNTER — Encounter: Payer: Self-pay | Admitting: Radiation Therapy

## 2014-11-10 NOTE — Progress Notes (Addendum)
Per Dr. Pearlie Oyster request I reached out to Dr. Windy Carina office to let him know about the status changes for [facility]. Dr. Saintclair Halsted is not in the office, but his partner, Dr. Annette Stable was able to respond.   Dr. Annette Stable advised that if the family is worried that [name] is having mental status changes, then at the minimum she needs to be evaluated in the emergency department with a follow-up head CT to evaluate for progression of hemorrhage.   I passed this on to [facility] and she is going to tell her brother and her father. I asked her to let the ED team know that these notes are here for review. Please refer back to the Conference note from 8/22  I entered and then to now since she became symptomatic about a week after the MRI and doctor visits.  [name]

## 2014-11-10 NOTE — Progress Notes (Signed)
Jill Chavez's daughter, Jill Chavez, called me to report some concerns about her mother's behavior over the weekend. She was last seen by Dr. Saintclair Halsted on Tuesday last week and the plan was to get another follow-up scan in 6 weeks.   - Walking not as steady - Has to think and process things longer than normal - Speech slurred - more forgetful and slight confusion  These are things that she was doing when she was first diagnosed with a brain met that required surgical resection on 07/02/14. The family is concerned since her last MRI showed enlargement of the surgical cavity and these symptoms have started again. They would like to know if there is anything that they can do, or do they just wait until the next MRI on 9/30 since the area is not felt to be progression, but hemorrhage.   I told Jill Chavez that I would pass this information on to Dr. Isidore Moos and that we will get back with her.   Current meds: Keppra and synthroid  Thanks, [name]

## 2014-11-11 ENCOUNTER — Emergency Department (HOSPITAL_COMMUNITY)
Admission: EM | Admit: 2014-11-11 | Discharge: 2014-11-11 | Disposition: A | Discharge status: Home / Self Care | Payer: 59 | Attending: Emergency Medicine | Admitting: Emergency Medicine

## 2014-11-11 ENCOUNTER — Emergency Department (HOSPITAL_COMMUNITY): Payer: 59

## 2014-11-11 ENCOUNTER — Encounter (HOSPITAL_COMMUNITY): Payer: Self-pay | Admitting: Emergency Medicine

## 2014-11-11 DIAGNOSIS — R269 Unspecified abnormalities of gait and mobility: Secondary | ICD-10-CM | POA: Diagnosis not present

## 2014-11-11 DIAGNOSIS — Z79899 Other long term (current) drug therapy: Secondary | ICD-10-CM | POA: Insufficient documentation

## 2014-11-11 DIAGNOSIS — R4701 Aphasia: Secondary | ICD-10-CM | POA: Diagnosis present

## 2014-11-11 DIAGNOSIS — C7931 Secondary malignant neoplasm of brain: Secondary | ICD-10-CM

## 2014-11-11 DIAGNOSIS — I629 Nontraumatic intracranial hemorrhage, unspecified: Secondary | ICD-10-CM | POA: Diagnosis not present

## 2014-11-11 DIAGNOSIS — E039 Hypothyroidism, unspecified: Secondary | ICD-10-CM | POA: Diagnosis not present

## 2014-11-11 DIAGNOSIS — G936 Cerebral edema: Secondary | ICD-10-CM | POA: Diagnosis not present

## 2014-11-11 DIAGNOSIS — Z8739 Personal history of other diseases of the musculoskeletal system and connective tissue: Secondary | ICD-10-CM | POA: Diagnosis not present

## 2014-11-11 DIAGNOSIS — Z8719 Personal history of other diseases of the digestive system: Secondary | ICD-10-CM | POA: Insufficient documentation

## 2014-11-11 LAB — BASIC METABOLIC PANEL
Anion gap: 5 (ref 5–15)
BUN: 8 mg/dL (ref 6–20)
CALCIUM: 10.8 mg/dL — AB (ref 8.9–10.3)
CO2: 28 mmol/L (ref 22–32)
Chloride: 108 mmol/L (ref 101–111)
Creatinine, Ser: 0.62 mg/dL (ref 0.44–1.00)
GFR calc Af Amer: >60 mL/min (ref >60)
GLUCOSE: 103 mg/dL — AB (ref 65–99)
Potassium: 3.7 mmol/L (ref 3.5–5.1)
Sodium: 141 mmol/L (ref 135–145)

## 2014-11-11 LAB — CBC WITH DIFFERENTIAL/PLATELET
Basophils Absolute: 0 10*3/uL (ref 0.0–0.1)
Basophils Relative: 0 % (ref 0–1)
EOS PCT: 1 % (ref 0–5)
Eosinophils Absolute: 0.1 10*3/uL (ref 0.0–0.7)
HCT: 37.3 % (ref 36.0–46.0)
Hemoglobin: 12 g/dL (ref 12.0–15.0)
LYMPHS ABS: 1.8 10*3/uL (ref 0.7–4.0)
LYMPHS PCT: 22 % (ref 12–46)
MCH: 28.3 pg (ref 26.0–34.0)
MCHC: 32.2 g/dL (ref 30.0–36.0)
MCV: 88 fL (ref 78.0–100.0)
MONO ABS: 0.6 10*3/uL (ref 0.1–1.0)
MONOS PCT: 7 % (ref 3–12)
Neutro Abs: 5.5 10*3/uL (ref 1.7–7.7)
Neutrophils Relative %: 70 % (ref 43–77)
PLATELETS: 204 10*3/uL (ref 150–400)
RBC: 4.24 MIL/uL (ref 3.87–5.11)
RDW: 13 % (ref 11.5–15.5)
WBC: 8 10*3/uL (ref 4.0–10.5)

## 2014-11-11 LAB — PROTIME-INR
INR: 1.13 (ref 0.00–1.49)
Prothrombin Time: 14.7 seconds (ref 11.6–15.2)

## 2014-11-11 MED ORDER — DEXAMETHASONE 4 MG PO TABS: 4.0000 mg | ORAL_TABLET | Freq: Three times a day (TID) | ORAL | Status: DC

## 2014-11-11 MED ORDER — DEXAMETHASONE 4 MG PO TABS
10.0000 mg | ORAL_TABLET | Freq: Once | ORAL | Status: AC
  Administered 2014-11-11: 10 mg via ORAL
  Filled 2014-11-11: qty 3

## 2014-11-11 NOTE — Discharge Instructions (Signed)
Metastatic Brain Tumor °A metastatic brain tumor is a growth in your brain. It is made of cancer cells from another part of your body that traveled to your brain through your bloodstream, along the nerves of your brain (cranial nerves), or through the opening at the base of your skull.  °CAUSES  °The most common cause of a metastatic brain tumor in men is lung cancer. In women, it is breast cancer. Other common causes include: °· Unknown types of cancers. °· Skin cancer (melanoma). °· Colon cancer. °· Kidney cancer. °SIGNS AND SYMPTOMS  °Most signs and symptoms of metastatic brain tumors are caused by increased pressure inside your brain. A headache is often the first symptom. Eventually, almost everyone with a metastatic brain tumor has a headache. Other signs and symptoms include: °· Vomiting. °· Weakness or fatigue. °· Seizures. °· Sensory problems, like tingling or numbness. °· Problems walking. °· Clumsiness. °· Emotional changes. °· Personality changes. °· Changes in speech or vision. °DIAGNOSIS  °Your health care provider can diagnose a metastatic brain tumor from your medical history and physical exam. You may also have some additional tests, including: °· A nervous system function test (neurologic exam). °· Imaging studies of your brain, such as an MRI and CT scan. °· Having a small piece of tissue removed (biopsy) from your brain to be checked under a microscope. °TREATMENT  °Treatment depends on the type of cancer you have and your overall health. It also depends on the size of your tumor and the number of brain tumors you have. The most common treatments include: °· Medicines to control pain, nausea, and seizures. °· Cancer-killing drugs (chemotherapy). °· An X-ray treatment called radiation therapy to kill brain tumor cells. °· A type of radiation therapy that is targeted to exact locations in the brain (stereotactic radiotherapy). °· Surgery to remove brain tumors. °HOME CARE INSTRUCTIONS °· Only take  medicines as directed by your health care provider. °· Do not drive if: °¨ You are taking strong pain medicines. °¨ Your vision or thinking is not clear. °· Take two 10-minute walks every day if you are able. Exercising is a good way to relieve stress. It can also help you sleep. °· Be sure to get enough calories and protein in your diet. °¨ If you have a poor appetite or feel nauseous, eat small meals often. °¨ Supplement your diet with protein shakes or milk shakes. °· It is common to have anxiety and depression when you are coping with cancer. °¨ Talk to friends and loved ones about your feelings. °¨ Have a good support system at home. °SEEK MEDICAL CARE IF: °· You have chills or fever. °· Your medicine is not controlling your symptoms. °· Your symptoms get worse or you develop new symptoms. °· You are having trouble caring for yourself or being cared for at home. °· You are struggling with anxiety or depression. °SEEK IMMEDIATE MEDICAL CARE IF: °· You cannot stop vomiting. °· You cannot keep down any foods or fluids. °· You have sudden changes in speech or vision. °· You have a seizure. °· You can no longer take care of yourself at home. °Document Released: 11/25/2003 Document Revised: 07/15/2013 Document Reviewed: 02/26/2008 °[employer]® Patient Information ©2015 [employer]. This information is not intended to replace advice given to you by your health care provider. Make sure you discuss any questions you have with your health care provider. ° °

## 2014-11-11 NOTE — ED Notes (Signed)
Pt here for slurred speech and being off balance; pt sts hx of brain tumor and having sx similar x 1 week

## 2014-11-11 NOTE — ED Notes (Signed)
Catalina Gravel, Nurse secretary, called the patient to tell her that she left some belongings here at the ED. Pt sts she will be coming to get them. Mandy placed the belongings in the soiled utility room.

## 2014-11-11 NOTE — ED Provider Notes (Signed)
CSN: [id number]     Arrival date & time 11/11/14  1107 History   First MD Initiated Contact with Patient 11/11/14 1215     Chief Complaint  Patient presents with  . Aphasia     (Consider location/radiation/quality/duration/timing/severity/associated sxs/prior Treatment) Patient is a 64 y.o. female presenting with neurologic complaint. The history is provided by the patient and the spouse.  Neurologic Problem This is a new problem. The current episode started more than 1 week ago. The problem occurs constantly. The problem has been gradually worsening. Pertinent negatives include no abdominal pain, no headaches and no shortness of breath. Nothing aggravates the symptoms. Nothing relieves the symptoms. She has tried nothing for the symptoms. The treatment provided no relief.    Past Medical History  Diagnosis Date  . Arthritis   . Hypothyroid   . GERD (gastroesophageal reflux disease)   . Brain metastasis   . S/P radiation therapy 06/02/14-07/14/14    RLL lung 60Gy/67fx  . S/P radiation therapy 07/23/14;07/25/14;07/28/14    Brain   Past Surgical History  Procedure Laterality Date  . Craniotomy Right 07/02/2014    Procedure: Stereotactic Craniotomy for right frontal brain tumor excision w/BrainLab;  Surgeon: Kary Kos, MD;  Location: [facility];  Service: Neurosurgery;  Laterality: Right;  right    History reviewed. No pertinent family history. Social History  Substance Use Topics  . Smoking status: Never Smoker   . Smokeless tobacco: None  . Alcohol Use: No   OB History    No data available     Review of Systems  Respiratory: Negative for shortness of breath.   Gastrointestinal: Negative for abdominal pain.  Neurological: Positive for speech difficulty. Negative for headaches.  All other systems reviewed and are negative.     Allergies  Review of patient's allergies indicates no known allergies.  Home Medications   Prior to Admission medications   Medication Sig  Start Date End Date Taking? Authorizing Provider  levothyroxine (SYNTHROID, LEVOTHROID) 125 MCG tablet Take 125 mcg by mouth daily before breakfast.  06/09/14  Yes Historical Provider, MD  levETIRAcetam (KEPPRA) 500 MG tablet Take 1 tablet (500 mg total) by mouth 2 (two) times daily. Patient not taking: Reported on 11/11/2014 07/05/14   Jonetta Osgood, MD  ondansetron (ZOFRAN) 4 MG tablet Take 1 tablet (4 mg total) by mouth every 4 (four) hours as needed for nausea or vomiting. Patient not taking: Reported on 08/27/2014 07/05/14   Jonetta Osgood, MD   BP 143/93 mmHg  Pulse 68  Temp(Src) 98.7 F (37.1 C) (Oral)  Resp 13  SpO2 97% Physical Exam  Constitutional: She is oriented to person, place, and time. She appears well-developed and well-nourished. No distress.  HENT:  Head: Normocephalic.  Eyes: Conjunctivae are normal.  Neck: Neck supple. No tracheal deviation present.  Cardiovascular: Normal rate and regular rhythm.   Pulmonary/Chest: Effort normal. No respiratory distress.  Abdominal: Soft. She exhibits no distension.  Neurological: She is alert and oriented to person, place, and time. She has normal strength. Coordination normal. GCS eye subscore is 4. GCS verbal subscore is 5. GCS motor subscore is 6.  Non-focal exam with apraxic speech and difficulty following complex commands appropriately. Normal heel-to-shin and finger-to-nose testing bilaterally  Skin: Skin is warm and dry.  Psychiatric: She has a normal mood and affect.    ED Course  Procedures (including critical care time) Labs Review Labs Reviewed  BASIC METABOLIC PANEL - Abnormal; Notable for the following:  Glucose, Bld 103 (*)    Calcium 10.8 (*)    All other components within normal limits  CBC WITH DIFFERENTIAL/PLATELET  PROTIME-INR    Imaging Review Ct Head Wo Contrast  11/11/2014   CLINICAL DATA:  Metastatic melanoma.  Right frontal craniotomy  EXAM: CT HEAD WITHOUT CONTRAST  TECHNIQUE: Contiguous  axial images were obtained from the base of the skull through the vertex without intravenous contrast.  COMPARISON:  MRI 10/31/2014  FINDINGS: Right frontal craniotomy for tumor resection. 3 x 4 cm cystic mass in the right frontal lobe similar to that seen on the prior MRI. This may be due to tumor and/or hemorrhage. High-density hemorrhage measuring 13 mm right frontal lobe unchanged. There is a large amount of white matter edema in the right frontal lobe causing mass-effect and 13 mm midline shift. These features are similar to the recent MRI.  24 mm hemorrhagic mass in the right temporal lobe is unchanged. 11 mm hemorrhagic mass left lateral temporal lobe also unchanged. Hemorrhagic mass right superior cerebellum unchanged measuring approximately 8 mm.  15 mm calcified mass left frontal convexity. This enhanced on the prior MRI and most likely is an incidental meningioma rather than metastatic disease given its dense calcification and dural attachment.  Negative for hydrocephalus.  IMPRESSION: Right frontal cystic mass is unchanged from the recent MRI may reflect hemorrhagic tumor. Large amount of surrounding edema is unchanged. There is 13 mm midline shift.  Multiple hemorrhagic lesions in the brain are similar to the recent MRI.  Calcified mass over the left frontal convexity likely a meningioma.  These results were called by telephone at the time of interpretation on 11/11/2014 at 13:45 Pm to Dr. Leo Grosser , who verbally acknowledged these results.   Electronically Signed   By: Franchot Gallo M.D.   On: 11/11/2014 14:03   I have personally reviewed and evaluated these images and lab results as part of my medical decision-making.   EKG Interpretation None      MDM   Final diagnoses:  Brain metastasis  Hemorrhage, intracranial  Gait abnormality  Cerebral edema   64 year old female with history of metastatic melanoma to the brain status post radiation oncology treatment with resultant bleeding  and edema notable on recent MRI presents with some progressive worsening of her symptoms at home with apraxia in her gait and speech, memory lapses, and difficulty with balance. CT scan was ordered to evaluate for interval changes and there appears to be some slight increase in edema with increased shift from 11 to 13 mm. No significant electrolyte dysfunction or hematologic abnormalities to suggest alternate etiology.  Spoke with on-call neurosurgeon Dr Kathyrn Sheriff who attempted to reach the patient's primary neurosurgeon Dr Saintclair Halsted but was unable. As the patient is not on any steroids at this time and there appears to be some interval worsening in the edema, we will start her on 10 mg Decadron here and then 4 mg every 8 hours until able to follow-up with her neurosurgeon this week per consultant recommendations. The family was in agreement with the plan and felt safe with the patient at home.  Leo Grosser, MD 11/11/14 [phone]

## 2014-11-11 NOTE — ED Notes (Signed)
MD at bedside. 

## 2014-12-02 ENCOUNTER — Telehealth: Payer: Self-pay | Admitting: Oncology

## 2014-12-02 ENCOUNTER — Encounter: Payer: Self-pay | Admitting: *Deleted

## 2014-12-02 NOTE — Telephone Encounter (Signed)
Confirmed appointment for 09/23 °

## 2014-12-02 NOTE — Progress Notes (Signed)
Sent pof to r/s 10-4 appt to 9-23, per dr Alen Blew.

## 2014-12-05 ENCOUNTER — Ambulatory Visit (HOSPITAL_BASED_OUTPATIENT_CLINIC_OR_DEPARTMENT_OTHER): Payer: 59 | Admitting: Oncology

## 2014-12-05 ENCOUNTER — Telehealth: Payer: Self-pay | Admitting: Oncology

## 2014-12-05 VITALS — BP 176/95 | HR 56 | Temp 98.4°F | Resp 18 | Ht 65.0 in | Wt 171.1 lb

## 2014-12-05 DIAGNOSIS — C439 Malignant melanoma of skin, unspecified: Secondary | ICD-10-CM | POA: Diagnosis not present

## 2014-12-05 MED ORDER — VEMURAFENIB 240 MG PO TABS: 960.0000 mg | ORAL_TABLET | Freq: Two times a day (BID) | ORAL | Status: DC

## 2014-12-05 NOTE — Telephone Encounter (Signed)
Pt confirmed labs/ov per 09/23 POF, gave pt AVS and Calendar... KJ, pt declined chemo education class

## 2014-12-05 NOTE — Progress Notes (Signed)
Hematology and Oncology Follow Up Visit  Jill Chavez [id number] 03/11/1951 64 y.o. 12/05/2014 12:10 PM   Principle Diagnosis: 64-year-old woman diagnosed with metastatic melanoma after presenting with large frontal lobe brain lesion in April 2016. She has no primary tumor identified and no evidence of other systemic metastasis. 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   Prior Therapy: She is status post craniotomy and resection of a right frontal hemorrhagic mass done on 07/02/2014. The pathology revealed a metastatic melanoma. She is status post SRS to 5 other sites of brain metastasis completed on 07/23/2014, 07/25/2014 and 07/28/2014.   Current therapy: Under evaluation for systemic therapy.   Interim History: Jill Chavez presents today for a follow-up visit. Since the last visit, she reports increase in her subcutaneous nodules. These nodules are noted on her scalp, forehead and behind her left ear. These have increased in size especially the one on her scalp. It has been associated with pain and discomfort. Her appointment was moved closer to evaluate these lesions. She does not report any new complaints otherwise.   She has been completely tapered off dexamethasone and her appetite is improving slightly after slight decline. She has not reported any headaches, blurry vision or syncope or seizures. Her gait and mobility have been normal. Her performance status is back to baseline.   She does not report any fevers, chills, sweats. She does not report any chest pain, palpitation orthopnea. She does not report any cough or hemoptysis or hematemesis. She does not report any nausea, vomiting, abdominal pain. She does not report any hematochezia or melon a. She does not report any frequency, urgency or hesitancy. She does not report any skeletal complaints. Remaining review of systems unremarkable.  Medications: I have reviewed the patient's current medications.   Current Outpatient Prescriptions  Medication Sig Dispense Refill  . levothyroxine (SYNTHROID, LEVOTHROID) 125 MCG tablet Take 125 mcg by mouth daily before breakfast.     . ondansetron (ZOFRAN) 4 MG tablet Take 1 tablet (4 mg total) by mouth every 4 (four) hours as needed for nausea or vomiting. (Patient not taking: Reported on 12/05/2014) 20 tablet 0  . vemurafenib (ZELBORAF) 240 MG tablet Take 4 tablets (960 mg total) by mouth every 12 (twelve) hours. Take with water. 240 tablet 0   No current facility-administered medications for this visit.     Allergies: No Known Allergies  Past Medical History, Surgical history, Social history, and Family History were reviewed and updated.   Physical Exam: Blood pressure 176/95, pulse 56, temperature 98.4 F (36.9 C), temperature source Oral, resp. rate 18, height 5' 5" (1.651 m), weight 171 lb 1.6 oz (77.61 kg), SpO2 98 %. ECOG: 0 General appearance: alert and cooperative not in any distress. Head: Normocephalic, without obvious abnormality no oral ulcers or lesions. Neck: no adenopathy.  Lymph nodes: Cervical, supraclavicular, and axillary nodes normal. Heart:regular rate and rhythm, S1, S2 normal, no murmur, click, rub or gallop Lung:chest clear, no wheezing, rales, normal symmetric air entry Abdomin: soft, non-tender, without masses or organomegaly EXT:no erythema, induration, or nodules Skin: Examination of her scalp showed enlarging scalp lesion with slight erythema and tenderness to touch. She has a small lesion on her forehead as well as behind her left ear. These are not tender to touch at this time.  Lab Results: Lab Results  Component Value Date   WBC 8.0 11/11/2014   HGB 12.0 11/11/2014   HCT 37.3 11/11/2014   MCV 88.0 11/11/2014     PLT 204 11/11/2014     Chemistry      Component Value Date/Time   NA 141 11/11/2014 1243   NA 145 10/21/2014 0818   K 3.7 11/11/2014 1243   K 3.7 10/21/2014 0818   CL 108 11/11/2014 1243    CO2 28 11/11/2014 1243   CO2 27 10/21/2014 0818   BUN 8 11/11/2014 1243   BUN 7.9 10/21/2014 0818   CREATININE 0.62 11/11/2014 1243   CREATININE 0.7 10/21/2014 0818      Component Value Date/Time   CALCIUM 10.8* 11/11/2014 1243   CALCIUM 10.8* 10/21/2014 0818   ALKPHOS 72 10/21/2014 0818   ALKPHOS 63 06/30/2014 0235   AST 12 10/21/2014 0818   AST 19 06/30/2014 0235   ALT 8 10/21/2014 0818   ALT 12 06/30/2014 0235   BILITOT 0.85 10/21/2014 0818   BILITOT 0.8 06/30/2014 0235         Impression and Plan:  64-year-old woman with the following issues:  1. Metastatic melanoma presenting with a brain metastasis with her initial CT scan chest abdomen and pelvis obtained on 06/29/2014 did not show any measurable disease. Her tumor was found to be BRAF mutated  PET CT scan on 10/21/2014 was reviewed and discussed with the patient again. Although she does not have any systemic disease she does have enlarging subcutaneous nodules. Given that her tumor is BRAF mutated she will certainly benefit oral targeted therapy.   Risks and benefits of Vemuafenib reviewed today. Written information was given to the patient. Complications include nausea, fatigue, hypertension, nonmelanoma skin cancer, QTC prolongation were reviewed. The benefit would include improvement in her subcutaneous nodules and controlling her systemic disease.  She will be referred to chemotherapy education class but is agreeable to proceed.   2. Brain metastasis: She is status post surgical resection followed by SRS. Her last MRI done in August 2016 did not show clear-cut progression but a repeat MRI is scheduled in the near future.     3. The scalp lesion: Appears to be enlarging at this time. It is certainly due to subcutaneous metastatic melanoma but it could also harbor an infection or an abscess. I am in favor of surgical drainage just to ensure resolution of any infectious process.  4. Dermatology surveillance: She  is currently following up with dermatology a regular basis.  5. Cardiovascular surveillance: She did have an EKG and had a normal QTc interval. Periodic monitoring will be needed on this current medication.  6. Follow-up: Will be in the next few weeks to assess any complications associated with Vemuafenib   SHADAD,FIRAS, MD 9/23/201612:10 PM 

## 2014-12-05 NOTE — Progress Notes (Signed)
Patient given written information on vemurafenib, script put in managed care's box, for assistance with specialty pharmacy.

## 2014-12-09 NOTE — Addendum Note (Signed)
Addended by: Wyatt Portela on: 12/09/2014 02:47 PM   Modules accepted: Orders

## 2014-12-11 ENCOUNTER — Encounter: Payer: Self-pay | Admitting: *Deleted

## 2014-12-11 ENCOUNTER — Other Ambulatory Visit: Payer: 59

## 2014-12-11 NOTE — Progress Notes (Signed)
Per dr Alen Blew, he has already made a referral for a plastic surgeon, patient notified.

## 2014-12-11 NOTE — Progress Notes (Unsigned)
Spoke with patient, she wanted a referral to a Psychiatric nurse, for lesion on her head. States she has not gotten her chemotherapy pills yet, and decided not to go to the chemo education class. Referred her back to her surgeon for referral to a plastic surgeon if needed. Informed her she must attend the chemo education class before starting the medication. Gave number to call and re-schedule. Left VM for raquel in managed care, for an update on vemurafenib status. Note to dr Hazeline Junker desk.

## 2014-12-12 ENCOUNTER — Encounter: Payer: Self-pay | Admitting: Oncology

## 2014-12-12 ENCOUNTER — Ambulatory Visit (HOSPITAL_COMMUNITY)
Admission: RE | Admit: 2014-12-12 | Discharge: 2014-12-12 | Disposition: A | Discharge status: Home / Self Care | Payer: 59 | Source: Ambulatory Visit | Attending: Radiation Oncology | Admitting: Radiation Oncology

## 2014-12-12 DIAGNOSIS — L989 Disorder of the skin and subcutaneous tissue, unspecified: Secondary | ICD-10-CM | POA: Diagnosis not present

## 2014-12-12 DIAGNOSIS — C7931 Secondary malignant neoplasm of brain: Secondary | ICD-10-CM | POA: Insufficient documentation

## 2014-12-12 DIAGNOSIS — C439 Malignant melanoma of skin, unspecified: Secondary | ICD-10-CM | POA: Insufficient documentation

## 2014-12-12 DIAGNOSIS — Z9889 Other specified postprocedural states: Secondary | ICD-10-CM | POA: Diagnosis not present

## 2014-12-12 MED ORDER — GADOBENATE DIMEGLUMINE 529 MG/ML IV SOLN
15.0000 mL | Freq: Once | INTRAVENOUS | Status: AC | PRN
  Administered 2014-12-12: 15 mL via INTRAVENOUS

## 2014-12-12 NOTE — Progress Notes (Signed)
Faxed biologics zelboraf req

## 2014-12-15 ENCOUNTER — Encounter: Payer: Self-pay | Admitting: Oncology

## 2014-12-15 NOTE — Progress Notes (Signed)
Per biologics script was transferred to [facility] [phone]

## 2014-12-16 ENCOUNTER — Ambulatory Visit: Payer: 59 | Admitting: Oncology

## 2014-12-16 ENCOUNTER — Encounter: Payer: Self-pay | Admitting: Oncology

## 2014-12-16 NOTE — Progress Notes (Signed)
I faxed [facility]. Prior auth form for zelboraf [phone]

## 2014-12-16 NOTE — Progress Notes (Signed)
I placed prior auth form for Zelboraf on desk of nurse for dr. Alen Blew

## 2014-12-17 ENCOUNTER — Encounter: Payer: Self-pay | Admitting: *Deleted

## 2014-12-17 ENCOUNTER — Encounter: Payer: Self-pay | Admitting: Oncology

## 2014-12-17 NOTE — Progress Notes (Signed)
Patient had called to leave a message about chemo class.  Only days she can come are mondays and tuesdays because of transportation. She will come on Monday 10/10 for the 10am class.  Patient understood.

## 2014-12-17 NOTE — Progress Notes (Signed)
Per [facility] zelboraf approved 12/16/14-12/16/15  Pa# [id number]. I sent to medical records

## 2014-12-22 ENCOUNTER — Other Ambulatory Visit: Payer: 59

## 2014-12-22 ENCOUNTER — Encounter: Payer: Self-pay | Admitting: *Deleted

## 2014-12-22 ENCOUNTER — Encounter: Payer: Self-pay | Admitting: Pharmacist

## 2014-12-22 NOTE — Progress Notes (Signed)
Oral Chemotherapy Pharmacist Encounter   I spoke with patient for overview of new oral chemotherapy medication for Melanoma: Vemurafenib. Pt is doing well. The prescriptions have been sent to the [facility]. Carley Hammed has received rx but patient states she has not received a call for delivery. I have provided [facility] # [phone] to patient and she will call today.   Written consent obtained from Ms. Karlton Lemon and sent to medical records after counseling.   Counseled patient on administration, dosing, side effects, safe handling, and monitoring. Side effects include but not limited to: secondary skin cancers (squamous cell carcinoma), hypertension, joint/muscle pain, fatigue, nausea, elevated liver enzymes, and rash.  Ms. Nielson voiced understanding and appreciation.   All questions answered.  Will follow up with patient regarding drug shipment in a few days and then will follow up in 1-2 weeks for adherence and toxicity management.   Thank you,  Montel Clock, PharmD, [facility]

## 2014-12-24 ENCOUNTER — Other Ambulatory Visit: Payer: Self-pay | Admitting: *Deleted

## 2014-12-24 ENCOUNTER — Telehealth: Payer: Self-pay | Admitting: Oncology

## 2014-12-24 ENCOUNTER — Encounter: Payer: Self-pay | Admitting: *Deleted

## 2014-12-24 DIAGNOSIS — D496 Neoplasm of unspecified behavior of brain: Secondary | ICD-10-CM

## 2014-12-24 DIAGNOSIS — G9389 Other specified disorders of brain: Secondary | ICD-10-CM

## 2014-12-24 DIAGNOSIS — C7931 Secondary malignant neoplasm of brain: Secondary | ICD-10-CM

## 2014-12-24 MED ORDER — PROCHLORPERAZINE MALEATE 10 MG PO TABS: 10.0000 mg | ORAL_TABLET | Freq: Four times a day (QID) | ORAL | Status: AC | PRN

## 2014-12-24 NOTE — Telephone Encounter (Signed)
Per patient r/s lab/fu from 10/14 to 11/29 due to transportation. Per patient son cannot bring her 10/14. Patient has new date/time.

## 2014-12-24 NOTE — Telephone Encounter (Signed)
Per desk nurse patient not able to wait until 11/29 this see FS - moved lab/FS to 10/18 @ 10:15 am for FS and 9:45 am for lab. Both me and desk nurse spoke with patient and confirmed appointment.

## 2014-12-24 NOTE — Progress Notes (Signed)
This RN spoke with patient regarding new oral chemotherapy pill. Patient stated,"I got the medicine yesterday and took the first pill at 05:00 am this morning." Per Montel Clock, RPH, I e-scribed a prescription for Compazine to the patient's pharmacy. Instructed patient to take Compazine first if she had any nausea. If nausea worsened, to take the Zofran. Patient verbalized understanding. Patient to return next Tuesday, 12/30/14 for lab/MD appt. Patient verbalized understanding of date and time of appointments.

## 2014-12-26 ENCOUNTER — Other Ambulatory Visit: Payer: 59

## 2014-12-26 ENCOUNTER — Ambulatory Visit: Payer: 59 | Admitting: Oncology

## 2014-12-30 ENCOUNTER — Ambulatory Visit (HOSPITAL_BASED_OUTPATIENT_CLINIC_OR_DEPARTMENT_OTHER): Payer: 59 | Admitting: Oncology

## 2014-12-30 ENCOUNTER — Ambulatory Visit (HOSPITAL_BASED_OUTPATIENT_CLINIC_OR_DEPARTMENT_OTHER): Payer: 59

## 2014-12-30 ENCOUNTER — Telehealth: Payer: Self-pay | Admitting: Oncology

## 2014-12-30 ENCOUNTER — Other Ambulatory Visit (HOSPITAL_BASED_OUTPATIENT_CLINIC_OR_DEPARTMENT_OTHER): Payer: 59

## 2014-12-30 DIAGNOSIS — C439 Malignant melanoma of skin, unspecified: Secondary | ICD-10-CM

## 2014-12-30 DIAGNOSIS — C7931 Secondary malignant neoplasm of brain: Secondary | ICD-10-CM | POA: Diagnosis not present

## 2014-12-30 DIAGNOSIS — R229 Localized swelling, mass and lump, unspecified: Secondary | ICD-10-CM | POA: Diagnosis not present

## 2014-12-30 LAB — CBC WITH DIFFERENTIAL/PLATELET
BASO%: 0.2 % (ref 0.0–2.0)
BASOS ABS: 0 10*3/uL (ref 0.0–0.1)
EOS ABS: 0.1 10*3/uL (ref 0.0–0.5)
EOS%: 0.8 % (ref 0.0–7.0)
HCT: 37.9 % (ref 34.8–46.6)
HGB: 12.4 g/dL (ref 11.6–15.9)
LYMPH%: 13.4 % — AB (ref 14.0–49.7)
MCH: 27.7 pg (ref 25.1–34.0)
MCHC: 32.7 g/dL (ref 31.5–36.0)
MCV: 84.6 fL (ref 79.5–101.0)
MONO#: 0.8 10*3/uL (ref 0.1–0.9)
MONO%: 9.2 % (ref 0.0–14.0)
NEUT#: 6.6 10*3/uL — ABNORMAL HIGH (ref 1.5–6.5)
NEUT%: 76.4 % (ref 38.4–76.8)
PLATELETS: 206 10*3/uL (ref 145–400)
RBC: 4.48 10*6/uL (ref 3.70–5.45)
RDW: 15.1 % — ABNORMAL HIGH (ref 11.2–14.5)
WBC: 8.7 10*3/uL (ref 3.9–10.3)
lymph#: 1.2 10*3/uL (ref 0.9–3.3)

## 2014-12-30 LAB — COMPREHENSIVE METABOLIC PANEL (CC13)
ALBUMIN: 3.9 g/dL (ref 3.5–5.0)
ALK PHOS: 91 U/L (ref 40–150)
ALT: 9 U/L (ref 0–55)
ANION GAP: 10 meq/L (ref 3–11)
AST: 10 U/L (ref 5–34)
BILIRUBIN TOTAL: 1.59 mg/dL — AB (ref 0.20–1.20)
BUN: 13.7 mg/dL (ref 7.0–26.0)
CO2: 25 mEq/L (ref 22–29)
CREATININE: 1 mg/dL (ref 0.6–1.1)
Calcium: 11.6 mg/dL — ABNORMAL HIGH (ref 8.4–10.4)
Chloride: 106 mEq/L (ref 98–109)
EGFR: 63 mL/min/{1.73_m2} — AB (ref >90)
GLUCOSE: 103 mg/dL (ref 70–140)
Potassium: 3.7 mEq/L (ref 3.5–5.1)
Sodium: 141 mEq/L (ref 136–145)
TOTAL PROTEIN: 7.3 g/dL (ref 6.4–8.3)

## 2014-12-30 MED ORDER — ZOLEDRONIC ACID 4 MG/100ML IV SOLN
4.0000 mg | Freq: Once | INTRAVENOUS | Status: AC
  Administered 2014-12-30: 4 mg via INTRAVENOUS
  Filled 2014-12-30: qty 100

## 2014-12-30 MED ORDER — SODIUM CHLORIDE 0.9 % IV SOLN
Freq: Once | INTRAVENOUS | Status: AC
  Administered 2014-12-30: 11:00:00 via INTRAVENOUS

## 2014-12-30 NOTE — Telephone Encounter (Signed)
Gave and printed appt sched and avs fo rpt; for NOV  °

## 2014-12-30 NOTE — Progress Notes (Signed)
Hematology and Oncology Follow Up Visit  Jill Chavez [id number] 09/26/1950 64 y.o. 12/30/2014 11:29 AM   Principle Diagnosis: 64 year old woman diagnosed with metastatic melanoma after presenting with large frontal lobe brain lesion in April 2016. She has no primary tumor identified and no evidence of other systemic metastasis. 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   Prior Therapy: She is status post craniotomy and resection of a right frontal hemorrhagic mass done on 07/02/2014. The pathology revealed a metastatic melanoma. She is status post SRS to 5 other sites of brain metastasis completed on 07/23/2014, 07/25/2014 and 07/28/2014.   Current therapy: Zelboraf 960 mg daily twice a day.  Interim History: Jill Chavez presents today for a follow-up visit. Since the last visit, she started Zelboraf last week without any major complications. She did not report any nausea, vomiting or excessive fatigue. She has reported in the last few days or unsteadiness and lethargy. He does not report any headaches or seizure activity. She reports slight decrease in her subcutaneous nodules. These nodules are noted on her scalp, forehead and behind her left ear.   She has been completely tapered off dexamethasone and her appetite is erratic at times. Her weight have declined slightly by 4 pounds. His ability to drink and eat is intact without any difficulty swallowing.  She does not report any fevers, chills, sweats. She does not report any chest pain, palpitation orthopnea. She does not report any cough or hemoptysis or hematemesis. She does not report any nausea, vomiting, abdominal pain. She does not report any hematochezia or melon a. She does not report any frequency, urgency or hesitancy. She does not report any skeletal complaints. Remaining review of systems unremarkable.  Medications: I have reviewed the patient's current medications.  Current Outpatient  Prescriptions  Medication Sig Dispense Refill  . levothyroxine (SYNTHROID, LEVOTHROID) 125 MCG tablet Take 125 mcg by mouth daily before breakfast.     . ondansetron (ZOFRAN) 4 MG tablet Take 1 tablet (4 mg total) by mouth every 4 (four) hours as needed for nausea or vomiting. 20 tablet 0  . prochlorperazine (COMPAZINE) 10 MG tablet Take 1 tablet (10 mg total) by mouth every 6 (six) hours as needed for nausea or vomiting. 30 tablet 0  . vemurafenib (ZELBORAF) 240 MG tablet Take 4 tablets (960 mg total) by mouth every 12 (twelve) hours. Take with water. 240 tablet 0   No current facility-administered medications for this visit.   Facility-Administered Medications Ordered in Other Visits  Medication Dose Route Frequency Provider Last Rate Last Dose  . zolendronic acid (ZOMETA) 4 mg in sodium chloride 0.9 % 100 mL IVPB  4 mg Intravenous Once Wyatt Portela, MD         Allergies: No Known Allergies  Past Medical History, Surgical history, Social history, and Family History were reviewed and updated.   Physical Exam: Blood pressure 154/89, pulse 71, temperature 99.1 F (37.3 C), temperature source Oral, resp. rate 18, height 5' 5"  (1.651 m), weight 167 lb 4.8 oz (75.887 kg), SpO2 99 %. ECOG: 0 General appearance: alert and cooperative chronically ill-appearing without distress. Head: Normocephalic, without obvious abnormality no oral ulcers or lesions. Neck: no adenopathy.  Lymph nodes: Cervical, supraclavicular, and axillary nodes normal. Heart:regular rate and rhythm, S1, S2 normal, no murmur, click, rub or gallop Lung:chest clear, no wheezing, rales, normal symmetric air entry Abdomin: soft, non-tender, without masses or organomegaly EXT:no erythema, induration, or nodules Skin: Examination of her scalp  showed the main lesion on her left temple to have shrunk in size with slight erythema noted. Very faint nodule noted on her forehead and behind her left ear.   Lab Results: Lab  Results  Component Value Date   WBC 8.7 12/30/2014   HGB 12.4 12/30/2014   HCT 37.9 12/30/2014   MCV 84.6 12/30/2014   PLT 206 12/30/2014     Chemistry      Component Value Date/Time   NA 141 12/30/2014 0959   NA 141 11/11/2014 1243   K 3.7 12/30/2014 0959   K 3.7 11/11/2014 1243   CL 108 11/11/2014 1243   CO2 25 12/30/2014 0959   CO2 28 11/11/2014 1243   BUN 13.7 12/30/2014 0959   BUN 8 11/11/2014 1243   CREATININE 1.0 12/30/2014 0959   CREATININE 0.62 11/11/2014 1243      Component Value Date/Time   CALCIUM 11.6* 12/30/2014 0959   CALCIUM 10.8* 11/11/2014 1243   ALKPHOS 91 12/30/2014 0959   ALKPHOS 63 06/30/2014 0235   AST 10 12/30/2014 0959   AST 19 06/30/2014 0235   ALT 9 12/30/2014 0959   ALT 12 06/30/2014 0235   BILITOT 1.59* 12/30/2014 0959   BILITOT 0.8 06/30/2014 0235       IMPRESSION: 1. Progression of disease since August: - Worsening hemorrhage and enhancement of the anterior right frontal lobe lesion. Increased edema and mass effect.  - New inferior left parietal lobe metastasis (series 10, image 73).  - Enlarging left scalp lesion suspicious for metastasis versus primary recurrence.  - Multiple enlarging left peri-auricular, parotid space, and upper cervical nodal station soft tissue nodules most compatible with metastatic adenopathy tracking from the scalp lesion.  2. Stable the slightly regressed right insula, left temporal lobe, left occipital lobe, and right cerebellar metastases. Some of the cerebral edema in the anterior right hemisphere might be referable to the residual right insula lesion. 3. Unchanged left vertex parasagittal probable meningioma.   Impression and Plan:  64 year old woman with the following issues:  1. Metastatic melanoma presenting with a brain metastasis with her initial CT scan chest abdomen and pelvis obtained on 06/29/2014 did not show any measurable disease. Her tumor was found to be BRAF mutated  PET  CT scan on 10/21/2014 was reviewed and discussed with the patient again. Although she does not have any systemic disease she does have enlarging subcutaneous nodules.   Given that her tumor is BRAF mutated, she was started on Zelboraf and have tolerated it very well so far. Her scalp nodules are stable and may be decreasing in the size of the lesion on the left temple.  The plan is to continue with the same dose and schedule and continue to monitor closely for any side effects. I plan on repeating a PET scan in the next 4-6 weeks.   2. Brain metastasis: She is status post surgical resection followed by [facility].   Her last MRI done on 12/12/2014. Her case was discussed and the brain tumor multidisciplinary conference and systemic therapy was recommended.     3. The scalp lesion: Appears to be responding at this time we'll continue to monitor this closely.  4. Dermatology surveillance: She is currently following up with dermatology a regular basis.  5. Cardiovascular surveillance: She did have an EKG and had a normal QTc interval. Periodic monitoring will be needed on this current medication.  7. Hypercalcemia: Likely related to malignancy and possible bone metastasis. This can explain her symptoms of unsteadiness,  lethargy and poor by mouth intake. I have recommended Zometa today to control her hypercalcemia. Risks and benefits of this medication were reviewed today. These complications include infusion related issues such as arthralgias, myalgias as well as osteonecrosis of the jaw.  She will need a bone scan in the near future to assess for any bone metastasis.  8. Follow-up: Will be in 2 weeks to follow-up on her clinical status.   [facility], MD 10/18/201611:29 AM

## 2014-12-30 NOTE — Patient Instructions (Signed)

## 2014-12-31 ENCOUNTER — Other Ambulatory Visit: Payer: Self-pay | Admitting: *Deleted

## 2014-12-31 ENCOUNTER — Telehealth: Payer: Self-pay | Admitting: Radiation Therapy

## 2014-12-31 MED ORDER — DEXAMETHASONE 4 MG PO TABS: 4.0000 mg | ORAL_TABLET | Freq: Two times a day (BID) | ORAL | Status: DC

## 2014-12-31 NOTE — Telephone Encounter (Signed)
Daughter called to say that Jill Chavez has had a decline after leaving the cancer center yesterday. Her gait has worsened and she has had a loss of bladder control with several accidents. [name] is wondering if her mother should go back on steroids since this has helped with her gait and confusion in the past.   I spoke with Dr. Isidore Moos about these complaints and she feels that this patient would benefit from a visit with [name] for her symptom management. i.e..[name] Kitchen Is it a UTI that requires antibiotics, or something in her spine that could be causing neurogenic bladder symptoms. Overall she would like her evaluated ASAP.  I called [name] to let her know that I have left a message with the scheduler in Med Onc requesting a visit with [name], and for her to be expecting a call.  [name] Special Procedures Navigator

## 2014-12-31 NOTE — Telephone Encounter (Signed)
Spoke with son [name], he states his mother is confused, unable to stand and walk on her own, is incontinent and may have trouble swallowing. Per dr Alen Blew, decadron 4 mg po bid x 30 days. If she is not any better she is to report to the E.D. [name] verbalized understanding.

## 2015-01-01 ENCOUNTER — Telehealth: Payer: Self-pay | Admitting: *Deleted

## 2015-01-01 NOTE — Telephone Encounter (Signed)
Pt daughter left message on [facility] phone in regards to pt declining condition. Loss of bladder control and unsteady gait worse than before. LM on daughter cell phone to have pt come into [facility] today.  Called pt at home number, Dr. Alen Blew refilled her Decadron last evening and she is doing better today. Gait is more steady and she has regained bladder function. Pt declines appt with [facility] today. Pt verbalized an understanding to call this office with any concerns or questions. Explained [facility] to pt for future needs. Pt very appreciative of phone call.

## 2015-01-02 ENCOUNTER — Telehealth: Payer: Self-pay | Admitting: Pharmacist

## 2015-01-02 NOTE — Telephone Encounter (Signed)
Oral Chemotherapy Follow-Up Form  Original Start date of oral chemotherapy: _10/12/16___  Called patient today to follow up regarding patient's oral chemotherapy medication: _Zelboraf (vemurafenib) for Melanoma__  Pt is doing well today. She had elevated calcium levels earlier this week and received a dose of zometa. She reports doing well with the Zelboraf with no missed doses and no side effects. She did have some issues this week with bladder control and balance. Her dexamethasone rx was refilled on Wednesday and she began feeling much better and her symptoms resolved after restarting the dexamethasone.    Pt reports __0__ tablets/doses missed in the last 2 weeks    Will follow up and call patient again in _1 week__   Thank you,  Montel Clock, PharmD, [facility]

## 2015-01-09 ENCOUNTER — Telehealth: Payer: Self-pay | Admitting: Oncology

## 2015-01-09 NOTE — Telephone Encounter (Signed)
pt called to confirm appts...pt ok and aware °

## 2015-01-12 ENCOUNTER — Other Ambulatory Visit: Payer: Self-pay | Admitting: *Deleted

## 2015-01-12 ENCOUNTER — Telehealth: Payer: Self-pay | Admitting: Pharmacist

## 2015-01-12 DIAGNOSIS — C7931 Secondary malignant neoplasm of brain: Secondary | ICD-10-CM

## 2015-01-12 DIAGNOSIS — C439 Malignant melanoma of skin, unspecified: Secondary | ICD-10-CM

## 2015-01-12 MED ORDER — VEMURAFENIB 240 MG PO TABS: 960.0000 mg | ORAL_TABLET | Freq: Two times a day (BID) | ORAL | Status: DC

## 2015-01-12 NOTE — Telephone Encounter (Signed)
..  Oral Chemotherapy Follow-Up Form  Original Start date of oral chemotherapy: _10/12/16__   Called patient today to follow up regarding patient's oral chemotherapy medication: _Vemurafenib for Melanoma___  Pt is doing well today. She will be in tomorrow 11/1 for follow up with Dr. Alen Blew. Pt reports mild fatigue that she has noticed over the past week or so. She also reports a small rash on her chest area. Ms. Soderberg states that the rash does not itch. She has used moisturizers and benadryl which help. I believe the main issue for Ms. Cimino however is her frustration with having to take 4 tablets twice daily of the Vemurafenib. Even though her symptoms (fatigue and rash) are mild and may not require dose adjustment it will be important to continue to motivate her to be adherent to her total daily dose. So far she has been compliant and has not missed a dose. Will follow up how her fatigue symptoms improve or change.  Pt reports __0__ tablets/doses missed in the last 2 weeks.    Pt reports the following side effects: _fatigue and mild rash on chest___  Other Issues: _number of tablets required (4 tablets twice daily of Vemurafenib)__   Will follow up and call patient again in __3 weeks___   Thank you,  Montel Clock, PharmD, [facility]

## 2015-01-13 ENCOUNTER — Ambulatory Visit (HOSPITAL_BASED_OUTPATIENT_CLINIC_OR_DEPARTMENT_OTHER): Payer: 59 | Admitting: Oncology

## 2015-01-13 ENCOUNTER — Ambulatory Visit (HOSPITAL_BASED_OUTPATIENT_CLINIC_OR_DEPARTMENT_OTHER): Payer: 59

## 2015-01-13 ENCOUNTER — Telehealth: Payer: Self-pay | Admitting: Oncology

## 2015-01-13 VITALS — BP 181/92 | HR 59 | Temp 98.4°F | Resp 20 | Ht 65.0 in | Wt 173.9 lb

## 2015-01-13 DIAGNOSIS — C7931 Secondary malignant neoplasm of brain: Secondary | ICD-10-CM | POA: Diagnosis not present

## 2015-01-13 DIAGNOSIS — C439 Malignant melanoma of skin, unspecified: Secondary | ICD-10-CM

## 2015-01-13 DIAGNOSIS — L989 Disorder of the skin and subcutaneous tissue, unspecified: Secondary | ICD-10-CM

## 2015-01-13 LAB — COMPREHENSIVE METABOLIC PANEL (CC13)
ALBUMIN: 3.7 g/dL (ref 3.5–5.0)
ALK PHOS: 62 U/L (ref 40–150)
ALT: 19 U/L (ref 0–55)
AST: 9 U/L (ref 5–34)
Anion Gap: 8 mEq/L (ref 3–11)
BILIRUBIN TOTAL: 1.1 mg/dL (ref 0.20–1.20)
BUN: 17.6 mg/dL (ref 7.0–26.0)
CALCIUM: 9.4 mg/dL (ref 8.4–10.4)
CO2: 24 mEq/L (ref 22–29)
CREATININE: 0.7 mg/dL (ref 0.6–1.1)
Chloride: 108 mEq/L (ref 98–109)
EGFR: >90 mL/min/{1.73_m2} (ref >90)
GLUCOSE: 86 mg/dL (ref 70–140)
POTASSIUM: 3.7 meq/L (ref 3.5–5.1)
Sodium: 141 mEq/L (ref 136–145)
TOTAL PROTEIN: 6.6 g/dL (ref 6.4–8.3)

## 2015-01-13 LAB — CBC WITH DIFFERENTIAL/PLATELET
BASO%: 0.1 % (ref 0.0–2.0)
BASOS ABS: 0 10*3/uL (ref 0.0–0.1)
EOS ABS: 0.1 10*3/uL (ref 0.0–0.5)
EOS%: 0.4 % (ref 0.0–7.0)
HCT: 37.1 % (ref 34.8–46.6)
HGB: 12.2 g/dL (ref 11.6–15.9)
LYMPH%: 8.1 % — ABNORMAL LOW (ref 14.0–49.7)
MCH: 27.4 pg (ref 25.1–34.0)
MCHC: 32.9 g/dL (ref 31.5–36.0)
MCV: 83.2 fL (ref 79.5–101.0)
MONO#: 1.2 10*3/uL — ABNORMAL HIGH (ref 0.1–0.9)
MONO%: 7.9 % (ref 0.0–14.0)
NEUT#: 12.3 10*3/uL — ABNORMAL HIGH (ref 1.5–6.5)
NEUT%: 83.5 % — AB (ref 38.4–76.8)
NRBC: 0 % (ref 0–0)
PLATELETS: 183 10*3/uL (ref 145–400)
RBC: 4.46 10*6/uL (ref 3.70–5.45)
RDW: 16.7 % — AB (ref 11.2–14.5)
WBC: 14.7 10*3/uL — ABNORMAL HIGH (ref 3.9–10.3)
lymph#: 1.2 10*3/uL (ref 0.9–3.3)

## 2015-01-13 NOTE — Telephone Encounter (Signed)
Gave and printed appt sched and avs fo rpt for Dec °

## 2015-01-13 NOTE — Progress Notes (Signed)
Hematology and Oncology Follow Up Visit  Jill Chavez [id number] Oct 30, 1950 64 y.o. 01/13/2015 10:38 AM   Principle Diagnosis: 64 year old woman diagnosed with metastatic melanoma after presenting with large frontal lobe brain lesion in April 2016. She has no primary tumor identified and no evidence of other systemic metastasis. 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   Prior Therapy: She is status post craniotomy and resection of a right frontal hemorrhagic mass done on 07/02/2014. The pathology revealed a metastatic melanoma. She is status post SRS to 5 other sites of brain metastasis completed on 07/23/2014, 07/25/2014 and 07/28/2014.   Current therapy: Zelboraf 960 mg daily twice a day started on 12/24/2014.  Interim History: Jill Chavez presents today for a follow-up visit. Since the last visit, she reports doing much better. She developed weakness, lethargy and incontinence and was prescribed dexamethasone 4 mg twice a day with complete resolution of her symptoms. She is ambulatory without any difficulties. She has not reported any weakness or incontinence.   She continues to tolerate Zelboraf without any major complications. She did develop diffuse erythema on her face, chest and arms consistent with a drug rash. It is not pleuritic in nature. She not report any nausea, vomiting or excessive fatigue.   He does not report any headaches or seizure activity. She reports continued decrease in her subcutaneous nodules. These nodules are noted on her scalp, forehead and behind her left ear.    She does not report any fevers, chills, sweats. She does not report any chest pain, palpitation orthopnea. She does not report any cough or hemoptysis or hematemesis. She does not report any nausea, vomiting, abdominal pain. She does not report any hematochezia or melon a. She does not report any frequency, urgency or hesitancy. She does not report any skeletal  complaints. Remaining review of systems unremarkable.  Medications: I have reviewed the patient's current medications.  Current Outpatient Prescriptions  Medication Sig Dispense Refill  . dexamethasone (DECADRON) 4 MG tablet Take 1 tablet (4 mg total) by mouth 2 (two) times daily. 60 tablet 0  . levothyroxine (SYNTHROID, LEVOTHROID) 125 MCG tablet Take 125 mcg by mouth daily before breakfast.     . prochlorperazine (COMPAZINE) 10 MG tablet Take 1 tablet (10 mg total) by mouth every 6 (six) hours as needed for nausea or vomiting. 30 tablet 0  . vemurafenib (ZELBORAF) 240 MG tablet Take 4 tablets (960 mg total) by mouth every 12 (twelve) hours. Take with water. 240 tablet 0  . ondansetron (ZOFRAN) 4 MG tablet Take 1 tablet (4 mg total) by mouth every 4 (four) hours as needed for nausea or vomiting. (Patient not taking: Reported on 01/13/2015) 20 tablet 0   No current facility-administered medications for this visit.     Allergies: No Known Allergies  Past Medical History, Surgical history, Social history, and Family History were reviewed and updated.   Physical Exam: Blood pressure 181/92, pulse 59, temperature 98.4 F (36.9 C), temperature source Oral, resp. rate 20, height 5' 5"  (1.651 m), weight 173 lb 14.4 oz (78.881 kg), SpO2 100 %. ECOG: 1 General appearance: alert and cooperative  did not appear in any distress. Head: Normocephalic, without obvious abnormality no oral ulcers or lesions. Neck: no adenopathy.  Lymph nodes: Cervical, supraclavicular, and axillary nodes normal. Heart:regular rate and rhythm, S1, S2 normal, no murmur, click, rub or gallop Lung:chest clear, no wheezing, rales, normal symmetric air entry Abdomin: soft, non-tender, without masses or organomegaly EXT:no erythema, induration,  or nodules Skin: Examination of her scalp showed the main lesion on her left temple to have shrunk in size with slight erythema noted. Very small nodule noted on her forehead and  behind her left ear. Diffuse erythema noted on her forehead and her chest.   Lab Results: Lab Results  Component Value Date   WBC 14.7* 01/13/2015   HGB 12.2 01/13/2015   HCT 37.1 01/13/2015   MCV 83.2 01/13/2015   PLT 183 01/13/2015     Chemistry      Component Value Date/Time   NA 141 12/30/2014 0959   NA 141 11/11/2014 1243   K 3.7 12/30/2014 0959   K 3.7 11/11/2014 1243   CL 108 11/11/2014 1243   CO2 25 12/30/2014 0959   CO2 28 11/11/2014 1243   BUN 13.7 12/30/2014 0959   BUN 8 11/11/2014 1243   CREATININE 1.0 12/30/2014 0959   CREATININE 0.62 11/11/2014 1243      Component Value Date/Time   CALCIUM 11.6* 12/30/2014 0959   CALCIUM 10.8* 11/11/2014 1243   ALKPHOS 91 12/30/2014 0959   ALKPHOS 63 06/30/2014 0235   AST 10 12/30/2014 0959   AST 19 06/30/2014 0235   ALT 9 12/30/2014 0959   ALT 12 06/30/2014 0235   BILITOT 1.59* 12/30/2014 0959   BILITOT 0.8 06/30/2014 0235        Impression and Plan:  64 year old woman with the following issues:  1. Metastatic melanoma presenting with a brain metastasis with her initial CT scan chest abdomen and pelvis obtained on 06/29/2014 did not show any measurable disease. Her tumor was found to be BRAF mutated  PET CT scan on 10/21/2014 was reviewed and discussed with the patient again. Although she does not have any systemic disease she does have enlarging subcutaneous nodules.   She was started on Zelboraf and have tolerated it very well so far. Her scalp nodules are stable and may be decreasing in the size of the lesion on the left temple.  She has reported increase erythematous rash on her chest and forehead. I have recommended decreasing the dose of Zelboraf to 3 tablets twice a day. Her total dose will be 720 mg twice a day.  Plan is to repeat a PET scan in 2 months.  2. Brain metastasis: She is status post surgical resection followed by [facility].   Her last MRI done on 12/12/2014. Her case was discussed and the brain  tumor multidisciplinary conference and systemic therapy was recommended. She is currently on dexamethasone 4 mg twice a day because of progressive neurological symptoms that includes headaches and lethargy. I have asked her to taper her dose down to 4 mg daily for the next month. We'll evaluate her next months for further dose reduction.     3. The scalp lesion: Appears to be responding at this time we'll continue to monitor this closely.  4. Dermatology surveillance: She is currently following up with dermatology a regular basis.  5. Cardiovascular surveillance: She did have an EKG and had a normal QTc interval. Periodic monitoring will be needed on this current medication.  7. Hypercalcemia: Likely related to malignancy and possible bone metastasis. She is status post Zometa in October 2016. Her calcium is currently pending from today.  She will need a bone scan in the near future to assess for any bone metastasis.  8. Follow-up: Will be in 4 weeks.   EMLJQG,BEEFE, MD 11/1/201610:38 AM

## 2015-01-27 ENCOUNTER — Ambulatory Visit (HOSPITAL_BASED_OUTPATIENT_CLINIC_OR_DEPARTMENT_OTHER): Payer: 59 | Admitting: Oncology

## 2015-01-27 ENCOUNTER — Other Ambulatory Visit: Payer: Self-pay

## 2015-01-27 ENCOUNTER — Ambulatory Visit (HOSPITAL_COMMUNITY)
Admission: RE | Admit: 2015-01-27 | Discharge: 2015-01-27 | Disposition: A | Discharge status: Home / Self Care | Payer: 59 | Source: Ambulatory Visit | Attending: Oncology | Admitting: Oncology

## 2015-01-27 ENCOUNTER — Other Ambulatory Visit: Payer: Self-pay | Admitting: Oncology

## 2015-01-27 VITALS — BP 186/84 | HR 69 | Temp 98.2°F | Resp 18 | Ht 65.0 in | Wt 174.6 lb

## 2015-01-27 DIAGNOSIS — C7931 Secondary malignant neoplasm of brain: Secondary | ICD-10-CM | POA: Insufficient documentation

## 2015-01-27 DIAGNOSIS — C439 Malignant melanoma of skin, unspecified: Secondary | ICD-10-CM

## 2015-01-27 DIAGNOSIS — H4710 Unspecified papilledema: Secondary | ICD-10-CM | POA: Diagnosis not present

## 2015-01-27 DIAGNOSIS — Z923 Personal history of irradiation: Secondary | ICD-10-CM | POA: Insufficient documentation

## 2015-01-27 DIAGNOSIS — C792 Secondary malignant neoplasm of skin: Secondary | ICD-10-CM | POA: Diagnosis not present

## 2015-01-27 DIAGNOSIS — L989 Disorder of the skin and subcutaneous tissue, unspecified: Secondary | ICD-10-CM | POA: Insufficient documentation

## 2015-01-27 DIAGNOSIS — G936 Cerebral edema: Secondary | ICD-10-CM

## 2015-01-27 MED ORDER — GADOBENATE DIMEGLUMINE 529 MG/ML IV SOLN
20.0000 mL | Freq: Once | INTRAVENOUS | Status: AC | PRN
  Administered 2015-01-27: 16 mL via INTRAVENOUS

## 2015-01-27 MED ORDER — DEXAMETHASONE 4 MG PO TABS: 4.0000 mg | ORAL_TABLET | Freq: Two times a day (BID) | ORAL | Status: DC

## 2015-01-27 NOTE — Progress Notes (Unsigned)
Hematology and Oncology Follow Up Visit  Alfa Leibensperger [id number] Jan 16, 1951 64 y.o. 01/27/2015 2:42 PM   Principle Diagnosis: 64 year old woman diagnosed with metastatic melanoma after presenting with large frontal lobe brain lesion in April 2016. She has no primary tumor identified and no evidence of other systemic metastasis. 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   Prior Therapy: She is status post craniotomy and resection of a right frontal hemorrhagic mass done on 07/02/2014. The pathology revealed a metastatic melanoma. She is status post SRS to 5 other sites of brain metastasis completed on 07/23/2014, 07/25/2014 and 07/28/2014.   Current therapy: Zelboraf 960 mg daily twice a day started on 12/24/2014.  Interim History: Ms. Bazaldua presents today for a follow-up visit. Since the last visit, she reports doing much better. She developed weakness, lethargy and incontinence and was prescribed dexamethasone 4 mg twice a day with complete resolution of her symptoms. She is ambulatory without any difficulties. She has not reported any weakness or incontinence.   She continues to tolerate Zelboraf without any major complications. She did develop diffuse erythema on her face, chest and arms consistent with a drug rash. It is not pleuritic in nature. She not report any nausea, vomiting or excessive fatigue.   He does not report any headaches or seizure activity. She reports continued decrease in her subcutaneous nodules. These nodules are noted on her scalp, forehead and behind her left ear.    She does not report any fevers, chills, sweats. She does not report any chest pain, palpitation orthopnea. She does not report any cough or hemoptysis or hematemesis. She does not report any nausea, vomiting, abdominal pain. She does not report any hematochezia or melon a. She does not report any frequency, urgency or hesitancy. She does not report any skeletal  complaints. Remaining review of systems unremarkable.  Medications: I have reviewed the patient's current medications.  Current Outpatient Prescriptions  Medication Sig Dispense Refill  . dexamethasone (DECADRON) 4 MG tablet Take 1 tablet (4 mg total) by mouth 2 (two) times daily. 60 tablet 0  . levothyroxine (SYNTHROID, LEVOTHROID) 125 MCG tablet Take 125 mcg by mouth daily before breakfast.     . ondansetron (ZOFRAN) 4 MG tablet Take 1 tablet (4 mg total) by mouth every 4 (four) hours as needed for nausea or vomiting. (Patient not taking: Reported on 01/13/2015) 20 tablet 0  . prochlorperazine (COMPAZINE) 10 MG tablet Take 1 tablet (10 mg total) by mouth every 6 (six) hours as needed for nausea or vomiting. 30 tablet 0  . vemurafenib (ZELBORAF) 240 MG tablet Take 4 tablets (960 mg total) by mouth every 12 (twelve) hours. Take with water. 240 tablet 0   No current facility-administered medications for this visit.     Allergies: No Known Allergies  Past Medical History, Surgical history, Social history, and Family History were reviewed and updated.   Physical Exam: There were no vitals taken for this visit. ECOG: 1 General appearance: alert and cooperative  did not appear in any distress. Head: Normocephalic, without obvious abnormality no oral ulcers or lesions. Neck: no adenopathy.  Lymph nodes: Cervical, supraclavicular, and axillary nodes normal. Heart:regular rate and rhythm, S1, S2 normal, no murmur, click, rub or gallop Lung:chest clear, no wheezing, rales, normal symmetric air entry Abdomin: soft, non-tender, without masses or organomegaly EXT:no erythema, induration, or nodules Skin: Examination of her scalp showed the main lesion on her left temple to have shrunk in size with slight erythema  noted. Very small nodule noted on her forehead and behind her left ear. Diffuse erythema noted on her forehead and her chest.   Lab Results: Lab Results  Component Value Date   WBC  14.7* 01/13/2015   HGB 12.2 01/13/2015   HCT 37.1 01/13/2015   MCV 83.2 01/13/2015   PLT 183 01/13/2015     Chemistry      Component Value Date/Time   NA 141 01/13/2015 1000   NA 141 11/11/2014 1243   K 3.7 01/13/2015 1000   K 3.7 11/11/2014 1243   CL 108 11/11/2014 1243   CO2 24 01/13/2015 1000   CO2 28 11/11/2014 1243   BUN 17.6 01/13/2015 1000   BUN 8 11/11/2014 1243   CREATININE 0.7 01/13/2015 1000   CREATININE 0.62 11/11/2014 1243      Component Value Date/Time   CALCIUM 9.4 01/13/2015 1000   CALCIUM 10.8* 11/11/2014 1243   ALKPHOS 62 01/13/2015 1000   ALKPHOS 63 06/30/2014 0235   AST 9 01/13/2015 1000   AST 19 06/30/2014 0235   ALT 19 01/13/2015 1000   ALT 12 06/30/2014 0235   BILITOT 1.10 01/13/2015 1000   BILITOT 0.8 06/30/2014 0235        Impression and Plan:  64 year old woman with the following issues:  1. Metastatic melanoma presenting with a brain metastasis with her initial CT scan chest abdomen and pelvis obtained on 06/29/2014 did not show any measurable disease. Her tumor was found to be BRAF mutated  PET CT scan on 10/21/2014 was reviewed and discussed with the patient again. Although she does not have any systemic disease she does have enlarging subcutaneous nodules.   She was started on Zelboraf and have tolerated it very well so far. Her scalp nodules are stable and may be decreasing in the size of the lesion on the left temple.  She has reported increase erythematous rash on her chest and forehead. I have recommended decreasing the dose of Zelboraf to 3 tablets twice a day. Her total dose will be 720 mg twice a day.  Plan is to repeat a PET scan in 2 months.  2. Brain metastasis: She is status post surgical resection followed by [facility].   Her last MRI done on 12/12/2014. Her case was discussed and the brain tumor multidisciplinary conference and systemic therapy was recommended. She is currently on dexamethasone 4 mg twice a day because of  progressive neurological symptoms that includes headaches and lethargy. I have asked her to taper her dose down to 4 mg daily for the next month. We'll evaluate her next months for further dose reduction.     3. The scalp lesion: Appears to be responding at this time we'll continue to monitor this closely.  4. Dermatology surveillance: She is currently following up with dermatology a regular basis.  5. Cardiovascular surveillance: She did have an EKG and had a normal QTc interval. Periodic monitoring will be needed on this current medication.  7. Hypercalcemia: Likely related to malignancy and possible bone metastasis. She is status post Zometa in October 2016. Her calcium is currently pending from today.  She will need a bone scan in the near future to assess for any bone metastasis.  8. Follow-up: Will be in 4 weeks.   Chauncey Cruel, MD 11/15/20162:42 PM

## 2015-01-27 NOTE — Progress Notes (Signed)
Zamoni Biedenbach   DOB:1950/12/01   [id number]   [id number]  Subjective:  I was called today by Dr. Jola Schmidt regarding Ms. Rys. She was therefore follow-up appointment and he noted bilateral ophthalmic nerve edema. This was a new finding compared to 2 weeks prior. By the time we were called the patient had already left's office and it was somewhat difficult to contact her, as she does not have a cell phone. At any rate we were able to reach her through family and set her up for a stat brain MRI today.  Pranati tells me that her dexamethasone is being tapered and she is currently at 4 mg twice a day. She denies current headaches, does have blurred vision but that is not a new or worse finding for her, and is not experiencing any nausea or vomiting problems. She denies recent falls. Overall she felt very stable as compared to the past couple of weeks. She tells me she is tolerating her "chemotherapy pill" without unusual side effects. A friend (who drove her here) was present during the visit  Objective: Middle-aged white woman in no acute distress Filed Vitals:   01/27/15 1452  BP: 186/84  Pulse: 69  Temp: 98.2 F (36.8 C)  Resp: 18    Body mass index is 29.05 kg/(m^2). @IOBRIEF @   Sclerae unicteric, extraocular movements full, pupils round and equal  Oropharynx clear  No peripheral adenopathy  Lungs clear -- no rales or rhonchi  Heart regular rate and rhythm  Abdomen benign  MSK no focal spinal tenderness, no peripheral edema  Neuro entirely nonfocal, with motor 5 over 5 throughout and no sensory level  Breast exam: Deferred  CBG (last 3)  No results for input(s): GLUCAP in the last 72 hours.   Labs:  Lab Results  Component Value Date   WBC 14.7* 01/13/2015   HGB 12.2 01/13/2015   HCT 37.1 01/13/2015   MCV 83.2 01/13/2015   PLT 183 01/13/2015   NEUTROABS 12.3* 01/13/2015      Chemistry      Component Value Date/Time   NA 141 01/13/2015 1000   NA 141 11/11/2014  1243   K 3.7 01/13/2015 1000   K 3.7 11/11/2014 1243   CL 108 11/11/2014 1243   CO2 24 01/13/2015 1000   CO2 28 11/11/2014 1243   BUN 17.6 01/13/2015 1000   BUN 8 11/11/2014 1243   CREATININE 0.7 01/13/2015 1000   CREATININE 0.62 11/11/2014 1243      Component Value Date/Time   CALCIUM 9.4 01/13/2015 1000   CALCIUM 10.8* 11/11/2014 1243   ALKPHOS 62 01/13/2015 1000   ALKPHOS 63 06/30/2014 0235   AST 9 01/13/2015 1000   AST 19 06/30/2014 0235   ALT 19 01/13/2015 1000   ALT 12 06/30/2014 0235   BILITOT 1.10 01/13/2015 1000   BILITOT 0.8 06/30/2014 0235       Urine Studies No results for input(s): UHGB, CRYS in the last 72 hours.  Invalid input(s): UACOL, UAPR, USPG, UPH, UTP, UGL, UKET, UBIL, UNIT, UROB, ULEU, UEPI, UWBC, URBC, UBAC, CAST, UCOM, BILUA  Basic Metabolic Panel: No results for input(s): NA, K, CL, CO2, GLUCOSE, BUN, CREATININE, CALCIUM, MG, PHOS in the last 168 hours. GFR Estimated Creatinine Clearance: 73.9 mL/min (by C-G formula based on Cr of 0.7). Liver Function Tests: No results for input(s): AST, ALT, ALKPHOS, BILITOT, PROT, ALBUMIN in the last 168 hours. No results for input(s): LIPASE, AMYLASE in the last 168 hours. No results for  input(s): AMMONIA in the last 168 hours. Coagulation profile No results for input(s): INR, PROTIME in the last 168 hours.  CBC: No results for input(s): WBC, NEUTROABS, HGB, HCT, MCV, PLT in the last 168 hours. Cardiac Enzymes: No results for input(s): CKTOTAL, CKMB, CKMBINDEX, TROPONINI in the last 168 hours. BNP: Invalid input(s): POCBNP CBG: No results for input(s): GLUCAP in the last 168 hours. D-Dimer No results for input(s): DDIMER in the last 72 hours. Hgb A1c No results for input(s): HGBA1C in the last 72 hours. Lipid Profile No results for input(s): CHOL, HDL, LDLCALC, TRIG, CHOLHDL, LDLDIRECT in the last 72 hours. Thyroid function studies No results for input(s): TSH, T4TOTAL, T3FREE, THYROIDAB in the  last 72 hours.  Invalid input(s): FREET3 Anemia work up No results for input(s): VITAMINB12, FOLATE, FERRITIN, TIBC, IRON, RETICCTPCT in the last 72 hours. Microbiology No results found for this or any previous visit (from the past 240 hour(s)).    Studies:  Mr Kizzie Fantasia Contrast  01/27/2015  CLINICAL DATA:  Bilateral papilledema. Metastatic melanoma status post right frontal lobe tumor resection and radiation therapy to multiple brain metastases. EXAM: MRI HEAD WITHOUT AND WITH CONTRAST TECHNIQUE: Multiplanar, multiecho pulse sequences of the brain and surrounding structures were obtained without and with intravenous contrast. CONTRAST:  46mL MULTIHANCE GADOBENATE DIMEGLUMINE 529 MG/ML IV SOLN COMPARISON:  12/12/2014 FINDINGS: There is no evidence of acute infarct or extra-axial fluid collection. Sequelae of right frontal craniotomy are again identified. Hemorrhagic lesion in the anterior right frontal lobe at the site of prior treatment has slightly decreased in size, measuring 5.5 x 3.6 cm (previously 6.1 x 4.0 cm). There is heterogeneous peripheral enhancement involving the more anterior and inferior aspects of the lesion with predominantly nonenhancing T1 hyperintense hemorrhage more posteriorly and superiorly. 11 mm of associated leftward midline shift does not appear significantly changed. Extensive surrounding vasogenic edema which extends into the anterior corpus callosum, deep white matter tracts about the basal ganglia, and right temporal lobe overall appears minimally improved in the superior frontal lobe and otherwise unchanged. Hemorrhagic right cerebellar metastasis has decreased in size, now measuring 7 mm (series 11, image 20, previously 10 mm). A 5 mm enhancing lesion in the posteromedial right temporal lobe has slightly increased in size (series 11, image 28, previously 3 mm). 12 x 10 mm hemorrhagic right insular lesion has decreased in size (series 11, image 28, previously 2.5 x 2.0  cm). Hemorrhagic left parietal lesion has decreased in size, now measuring 5 x 2 mm (series 11, image 36, previously 8 x 6 mm). Hemorrhagic left temporal lobe lesion has also decreased in size, measuring 11 x 5 mm (series 11, image 30, previously 13 x 10 mm). A punctate focus of chronic blood products is noted in the left occipital lobe at the prior lesion, without residual enhancement (series 8, image 10). 1.3 cm left frontoparietal parafalcine extra-axial lesion at the vertex is unchanged (series 11, image 51). No new enhancing brain lesions are identified. Left frontal scalp lesion is smaller, measuring 14 mm (series 11, image 45, previously 20 mm). Multiple soft tissue nodules within and about the left parotid gland on the prior study have decreased in size, with the largest nodule now measuring 10 mm (series 12, image 14, previously 15 mm). A partially empty sella is unchanged. The ventricles are unchanged in size. Paranasal sinuses and mastoid air cells are clear. Major intracranial vascular flow voids are preserved. IMPRESSION: 1. Slightly increased size of 5 mm right temporal  metastasis. 2. Slightly decreased size of other brain metastases, including the large hemorrhage in the anterior right frontal lobe. 3. No evidence of new brain metastases. 4. Unchanged 1.3 cm extra-axial parafalcine lesion near the vertex, likely a meningioma. 5. Decreased size of left frontal scalp lesion and left neck/parotid nodules/lymph nodes. Electronically Signed   By: Logan Bores M.D.   On: 01/27/2015 14:57    Assessment: 64 y.o. [place] woman with a history of BR AF mutated metastatic melanoma involving the brain and skin  (1) status post right frontal craniotomy 07/02/2014 followed by stereotactic radiosurgery 07/23/2014, 07/25/2014, 07/28/2014 PTV1 Right frontal 51mm tumor cavity target was treated using 5 Rapid Arc VMAT Beams to a prescription dose of 24 GY in 3 fractions, as follows PTV's 2-5 were DCA's. 4 table  angles were used with a single isocenter: PTV2 R insular 74mm received 18 Gy,  PTV3 L Lat Temporal 50mm received 20 Gy,  PTV4 R Cerebellar 4mm received 20 Gy,  PTV5 L Occipital 79mm lesion received 20 Gy  (2) on vemurafenib as of 12/05/2014   Plan:  The stat brain MRI obtained today fortunately shows no evidence of disease progression, and actually shows evidence of response, which is very favorable. However there is significant residual edema, which likely explains the ophthalmological findings. Accordingly I asked Emmeline to go back to 8 mg of dexamethasone twice daily with food until she meets with Dr. Alen Blew next week.  She knows to call for any other problems that may develop before her next visit here.     Chauncey Cruel, MD 01/27/2015  6:33 PM Medical Oncology and Hematology [facility] [address] [place], [place] [address] Tel. [phone]    Fax. [phone]

## 2015-02-03 ENCOUNTER — Telehealth: Payer: Self-pay | Admitting: Pharmacist

## 2015-02-03 NOTE — Telephone Encounter (Signed)
11/22 - Called patient to follow up on oral chemotherapy medication: Vemurafenib. Unable to reach patient. Left VM for patient to call back to discuss any questions. Pt had stat brain MRI last week which showed some response (see previous notes). Pt will edema resulting in dexamethasone dose being increased by Dr. Jana Hakim (on call for Dr. Alen Blew)  Thank you,  Montel Clock, PharmD, [facility]

## 2015-02-06 ENCOUNTER — Other Ambulatory Visit: Payer: Self-pay | Admitting: Radiation Oncology

## 2015-02-10 ENCOUNTER — Other Ambulatory Visit: Payer: 59

## 2015-02-10 ENCOUNTER — Ambulatory Visit: Payer: 59 | Admitting: Oncology

## 2015-02-12 ENCOUNTER — Telehealth: Payer: Self-pay | Admitting: Oncology

## 2015-02-12 ENCOUNTER — Other Ambulatory Visit (HOSPITAL_BASED_OUTPATIENT_CLINIC_OR_DEPARTMENT_OTHER): Payer: 59

## 2015-02-12 ENCOUNTER — Ambulatory Visit (HOSPITAL_BASED_OUTPATIENT_CLINIC_OR_DEPARTMENT_OTHER): Payer: 59 | Admitting: Oncology

## 2015-02-12 VITALS — BP 157/87 | HR 69 | Temp 98.5°F | Resp 18 | Ht 65.0 in | Wt 178.6 lb

## 2015-02-12 DIAGNOSIS — C7931 Secondary malignant neoplasm of brain: Secondary | ICD-10-CM | POA: Diagnosis not present

## 2015-02-12 DIAGNOSIS — C439 Malignant melanoma of skin, unspecified: Secondary | ICD-10-CM

## 2015-02-12 DIAGNOSIS — H4710 Unspecified papilledema: Secondary | ICD-10-CM

## 2015-02-12 DIAGNOSIS — L989 Disorder of the skin and subcutaneous tissue, unspecified: Secondary | ICD-10-CM

## 2015-02-12 LAB — CBC WITH DIFFERENTIAL/PLATELET
BASO%: 0.4 % (ref 0.0–2.0)
Basophils Absolute: 0.1 10*3/uL (ref 0.0–0.1)
EOS ABS: 0 10*3/uL (ref 0.0–0.5)
EOS%: 0.3 % (ref 0.0–7.0)
HEMATOCRIT: 39.8 % (ref 34.8–46.6)
HEMOGLOBIN: 12.7 g/dL (ref 11.6–15.9)
LYMPH#: 1 10*3/uL (ref 0.9–3.3)
LYMPH%: 8.7 % — AB (ref 14.0–49.7)
MCH: 28.4 pg (ref 25.1–34.0)
MCHC: 31.9 g/dL (ref 31.5–36.0)
MCV: 89 fL (ref 79.5–101.0)
MONO#: 0.9 10*3/uL (ref 0.1–0.9)
MONO%: 7.3 % (ref 0.0–14.0)
NEUT#: 9.8 10*3/uL — ABNORMAL HIGH (ref 1.5–6.5)
NEUT%: 83.3 % — AB (ref 38.4–76.8)
PLATELETS: 159 10*3/uL (ref 145–400)
RBC: 4.47 10*6/uL (ref 3.70–5.45)
RDW: 18 % — ABNORMAL HIGH (ref 11.2–14.5)
WBC: 11.8 10*3/uL — ABNORMAL HIGH (ref 3.9–10.3)
nRBC: 0 % (ref 0–0)

## 2015-02-12 LAB — COMPREHENSIVE METABOLIC PANEL (CC13)
ALBUMIN: 3.7 g/dL (ref 3.5–5.0)
ALK PHOS: 63 U/L (ref 40–150)
ALT: 28 U/L (ref 0–55)
ANION GAP: 9 meq/L (ref 3–11)
AST: 13 U/L (ref 5–34)
BUN: 13.1 mg/dL (ref 7.0–26.0)
CALCIUM: 10.3 mg/dL (ref 8.4–10.4)
CHLORIDE: 107 meq/L (ref 98–109)
CO2: 25 mEq/L (ref 22–29)
CREATININE: 0.9 mg/dL (ref 0.6–1.1)
EGFR: 72 mL/min/{1.73_m2} — ABNORMAL LOW (ref >90)
Glucose: 108 mg/dl (ref 70–140)
Potassium: 3.6 mEq/L (ref 3.5–5.1)
Sodium: 141 mEq/L (ref 136–145)
Total Bilirubin: 1.47 mg/dL — ABNORMAL HIGH (ref 0.20–1.20)
Total Protein: 6.9 g/dL (ref 6.4–8.3)

## 2015-02-12 LAB — TECHNOLOGIST REVIEW

## 2015-02-12 MED ORDER — FLUCONAZOLE 100 MG PO TABS: 100.0000 mg | ORAL_TABLET | Freq: Every day | ORAL | Status: DC

## 2015-02-12 MED ORDER — DEXAMETHASONE 4 MG PO TABS: 4.0000 mg | ORAL_TABLET | Freq: Every day | ORAL | Status: DC

## 2015-02-12 NOTE — Progress Notes (Signed)
Hematology and Oncology Follow Up Visit  Jill Chavez [id number] Aug 09, 1950 64 y.o. 02/12/2015 10:15 AM   Principle Diagnosis: 64 year old woman diagnosed with metastatic melanoma after presenting with large frontal lobe brain lesion in April 2016. She has no primary tumor identified and no evidence of other systemic metastasis. 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   Prior Therapy: She is status post craniotomy and resection of a right frontal hemorrhagic mass done on 07/02/2014. The pathology revealed a metastatic melanoma. She is status post SRS to 5 other sites of brain metastasis completed on 07/23/2014, 07/25/2014 and 07/28/2014.   Current therapy:  Zelboraf 960 mg daily twice a day started on 12/24/2014. This was changed to 720 mg twice a day in November 2016 for better tolerance. Dexamethasone 4 mg tablets twice a day starting on 01/27/2015 and the dose was reduced again to 4 mg daily in 02/12/2015.   Interim History: Jill Chavez presents today for a follow-up visit. Since the last visit, she was evaluated by Dr. Valetta Close from ophthalmology and was noted to have marked optic disc edema and required urgent evaluation on 01/27/2015. She had a urgent MRI which showed that she had a slight decrease in the size of her brain metastasis including the large hemorrhage in the anterior right frontal lobe. No evidence of any new metastasis noted.   Patient was seen by Dr. Jana Hakim in my absence and increased her dexamethasone dose to 4 mg twice a day. She tolerated that dose well toe about last week and on her on decreased to 4 mg daily.   Clinically, she feels very well at this time. She does not report any neurological deficits. She does not report any headaches, blurry vision, falls or seizures. She is able to ambulate without any difficulties. Her appetite is excellent. She is having some more troubles with his steroids predominantly with fluid retention. She  does have hoarseness in the voice associated with her Zelboraf.   She continues to tolerate Zelboraf without any major complications.   Seems to have improved since last visit.  She reports continued decrease in her subcutaneous nodules. These nodules are noted on her scalp, forehead and behind her left ear.    She does not report any fevers, chills, sweats. She does not report any chest pain, palpitation orthopnea. She does not report any cough or hemoptysis or hematemesis. She does not report any nausea, vomiting, abdominal pain. She does not report any hematochezia or melon a. She does not report any frequency, urgency or hesitancy. She does not report any skeletal complaints. Remaining review of systems unremarkable.  Medications: I have reviewed the patient's current medications.  Current Outpatient Prescriptions  Medication Sig Dispense Refill  . dexamethasone (DECADRON) 4 MG tablet Take 1 tablet (4 mg total) by mouth daily. 60 tablet 0  . dexamethasone (DECADRON) 4 MG tablet Take 1 tablet (4 mg total) by mouth daily. 60 tablet 0  . levothyroxine (SYNTHROID, LEVOTHROID) 125 MCG tablet Take 125 mcg by mouth daily before breakfast.     . vemurafenib (ZELBORAF) 240 MG tablet Take 4 tablets (960 mg total) by mouth every 12 (twelve) hours. Take with water. 240 tablet 0  . fluconazole (DIFLUCAN) 100 MG tablet Take 1 tablet (100 mg total) by mouth daily. 7 tablet 0  . ondansetron (ZOFRAN) 4 MG tablet Take 1 tablet (4 mg total) by mouth every 4 (four) hours as needed for nausea or vomiting. (Patient not taking: Reported on 01/13/2015)  20 tablet 0  . prochlorperazine (COMPAZINE) 10 MG tablet Take 1 tablet (10 mg total) by mouth every 6 (six) hours as needed for nausea or vomiting. (Patient not taking: Reported on 02/12/2015) 30 tablet 0   No current facility-administered medications for this visit.     Allergies: No Known Allergies  Past Medical History, Surgical history, Social history, and  Family History were reviewed and updated.   Physical Exam: Blood pressure 157/87, pulse 69, temperature 98.5 F (36.9 C), temperature source Oral, resp. rate 18, height 5' 5"  (1.651 m), weight 178 lb 9.6 oz (81.012 kg), SpO2 99 %. ECOG: 1 General appearance: alert and cooperative  did not appear in any distress. Head: Normocephalic, without obvious abnormality or thrush noted. Neck: no adenopathy. 5 pads noted supraclavicular area bilaterally. Lymph nodes: Cervical, supraclavicular, and axillary nodes normal. Heart:regular rate and rhythm, S1, S2 normal, no murmur, click, rub or gallop Lung:chest clear, no wheezing, rales, normal symmetric air entry Abdomin: soft, non-tender, without masses or organomegaly EXT:no erythema, induration, or nodules Skin: Continues decrease of her skin nodules noted on the temporal area as well as forehead. Her diffuse erythema have subsided.   Lab Results: Lab Results  Component Value Date   WBC 11.8* 02/12/2015   HGB 12.7 02/12/2015   HCT 39.8 02/12/2015   MCV 89.0 02/12/2015   PLT 159 02/12/2015     Chemistry      Component Value Date/Time   NA 141 01/13/2015 1000   NA 141 11/11/2014 1243   K 3.7 01/13/2015 1000   K 3.7 11/11/2014 1243   CL 108 11/11/2014 1243   CO2 24 01/13/2015 1000   CO2 28 11/11/2014 1243   BUN 17.6 01/13/2015 1000   BUN 8 11/11/2014 1243   CREATININE 0.7 01/13/2015 1000   CREATININE 0.62 11/11/2014 1243      Component Value Date/Time   CALCIUM 9.4 01/13/2015 1000   CALCIUM 10.8* 11/11/2014 1243   ALKPHOS 62 01/13/2015 1000   ALKPHOS 63 06/30/2014 0235   AST 9 01/13/2015 1000   AST 19 06/30/2014 0235   ALT 19 01/13/2015 1000   ALT 12 06/30/2014 0235   BILITOT 1.10 01/13/2015 1000   BILITOT 0.8 06/30/2014 0235      IMPRESSION: 1. Slightly increased size of 5 mm right temporal metastasis. 2. Slightly decreased size of other brain metastases, including the large hemorrhage in the anterior right frontal  lobe. 3. No evidence of new brain metastases. 4. Unchanged 1.3 cm extra-axial parafalcine lesion near the vertex, likely a meningioma. 5. Decreased size of left frontal scalp lesion and left neck/parotid nodules/lymph nodes.  Impression and Plan:  64 year old woman with the following issues:  1. Metastatic melanoma presenting with a brain metastasis with her initial CT scan chest abdomen and pelvis obtained on 06/29/2014 did not show any measurable disease. Her tumor was found to be BRAF mutated  PET CT scan on 10/21/2014 was reviewed and discussed with the patient again. Although she does not have any systemic disease she does have enlarging subcutaneous nodules.   She was started on Zelboraf and have tolerated it very well so far. Her scalp nodules are stable and may be decreasing in the size of the lesion on the left temple. She has tolerated this medication better at 720 mg twice a day and the plan is to continue with the current dose. Plan to repeat PET CT scan in 1 month.    2. Brain metastasis: She is status post surgical resection  followed by [facility]. Last MRI done on 01/27/2015 which showed for the most part stable disease and slight decrease in the majority of her brain metastasis. I recommended continued dexamethasone 4 mg daily and plan very slow taper in the near future.    3. The scalp lesion: Appears to be responding at this time we'll continue to monitor this closely.  4. Dermatology surveillance: She is currently following up with dermatology a regular basis.  5. Cardiovascular surveillance: She did have an EKG and had a normal QTc interval. Periodic monitoring will be needed on this current medication.  6. Bilateral papilledema noted on examination by Dr. Valetta Close. Her repeat MRI did not show major changes at this time. I'm hoping the increased dose of dexamethasone have helped at this time. I will communicate with Dr. Valetta Close and work closely to make sure this issue have  resolved at this time.  7. Hypercalcemia: Likely related to malignancy and possible bone metastasis. She is status post Zometa in October 2016. Her calcium was within normal range on 01/13/2015.  8. Follow-up: Will be in 4 weeks.   TAEWYB,RKVTX, MD 12/1/201610:15 AM

## 2015-02-12 NOTE — Telephone Encounter (Signed)
Pt didi not want to sched appt now....will call back to sched appt once she finds out when her eye appt is

## 2015-02-13 ENCOUNTER — Telehealth: Payer: Self-pay | Admitting: Oncology

## 2015-02-13 NOTE — Telephone Encounter (Signed)
returned call and s.w. pt and confirmed appt °

## 2015-02-18 ENCOUNTER — Other Ambulatory Visit: Payer: Self-pay | Admitting: *Deleted

## 2015-02-18 ENCOUNTER — Telehealth: Payer: Self-pay | Admitting: *Deleted

## 2015-02-18 DIAGNOSIS — C439 Malignant melanoma of skin, unspecified: Secondary | ICD-10-CM

## 2015-02-18 DIAGNOSIS — C7931 Secondary malignant neoplasm of brain: Secondary | ICD-10-CM

## 2015-02-18 MED ORDER — VEMURAFENIB 240 MG PO TABS: 960.0000 mg | ORAL_TABLET | Freq: Two times a day (BID) | ORAL | Status: DC

## 2015-02-18 NOTE — Telephone Encounter (Signed)
VM message from [facility] @ [place] requesting refill prescription for pt's Zelboraf.

## 2015-02-19 ENCOUNTER — Encounter: Payer: Self-pay | Admitting: Pharmacist

## 2015-02-19 NOTE — Progress Notes (Signed)
Oral Chemotherapy Follow-Up Form  Original Start date of oral chemotherapy: __10/12/16_____   Called patient today to follow up regarding patient's oral chemotherapy medication: _Vemurafenib__  Pt is doing well today. No complaints. She is tolerating slight dose reduction of 720 mg (3 tablets) twice daily. No issues to report at this time. She is looking forward to the holidays  Pt reports __0__ tablets/doses missed in the last week/month.    Pt reports the following side effects: __N/A______     Will follow up and call patient again in __January 2017_______   Thank you,  Montel Clock, PharmD, [facility]

## 2015-03-06 ENCOUNTER — Other Ambulatory Visit: Payer: Self-pay | Admitting: *Deleted

## 2015-03-06 ENCOUNTER — Encounter: Payer: Self-pay | Admitting: *Deleted

## 2015-03-06 DIAGNOSIS — C439 Malignant melanoma of skin, unspecified: Secondary | ICD-10-CM

## 2015-03-06 DIAGNOSIS — C7931 Secondary malignant neoplasm of brain: Secondary | ICD-10-CM

## 2015-03-06 MED ORDER — VEMURAFENIB 240 MG PO TABS: 960.0000 mg | ORAL_TABLET | Freq: Two times a day (BID) | ORAL | Status: DC

## 2015-03-12 ENCOUNTER — Other Ambulatory Visit (HOSPITAL_BASED_OUTPATIENT_CLINIC_OR_DEPARTMENT_OTHER): Payer: 59

## 2015-03-12 ENCOUNTER — Other Ambulatory Visit: Payer: Self-pay | Admitting: Oncology

## 2015-03-12 ENCOUNTER — Encounter (HOSPITAL_COMMUNITY)
Admission: RE | Admit: 2015-03-12 | Discharge: 2015-03-12 | Disposition: A | Discharge status: Home / Self Care | Payer: 59 | Source: Ambulatory Visit | Attending: Oncology | Admitting: Oncology

## 2015-03-12 DIAGNOSIS — C439 Malignant melanoma of skin, unspecified: Secondary | ICD-10-CM | POA: Diagnosis not present

## 2015-03-12 LAB — CBC WITH DIFFERENTIAL/PLATELET
BASO%: 0.7 % (ref 0.0–2.0)
BASOS ABS: 0 10*3/uL (ref 0.0–0.1)
EOS ABS: 0.1 10*3/uL (ref 0.0–0.5)
EOS%: 2.5 % (ref 0.0–7.0)
HCT: 37.8 % (ref 34.8–46.6)
HEMOGLOBIN: 12.3 g/dL (ref 11.6–15.9)
LYMPH%: 35.6 % (ref 14.0–49.7)
MCH: 29.2 pg (ref 25.1–34.0)
MCHC: 32.5 g/dL (ref 31.5–36.0)
MCV: 89.8 fL (ref 79.5–101.0)
MONO#: 0.3 10*3/uL (ref 0.1–0.9)
MONO%: 10.9 % (ref 0.0–14.0)
NEUT#: 1.4 10*3/uL — ABNORMAL LOW (ref 1.5–6.5)
NEUT%: 50.3 % (ref 38.4–76.8)
Platelets: 171 10*3/uL (ref 145–400)
RBC: 4.21 10*6/uL (ref 3.70–5.45)
RDW: 16.2 % — AB (ref 11.2–14.5)
WBC: 2.8 10*3/uL — ABNORMAL LOW (ref 3.9–10.3)
lymph#: 1 10*3/uL (ref 0.9–3.3)

## 2015-03-12 LAB — COMPREHENSIVE METABOLIC PANEL
ALBUMIN: 3.6 g/dL (ref 3.5–5.0)
ALK PHOS: 66 U/L (ref 40–150)
ALT: 44 U/L (ref 0–55)
AST: 17 U/L (ref 5–34)
Anion Gap: 10 mEq/L (ref 3–11)
BUN: 11.9 mg/dL (ref 7.0–26.0)
CALCIUM: 9.6 mg/dL (ref 8.4–10.4)
CO2: 26 mEq/L (ref 22–29)
Chloride: 109 mEq/L (ref 98–109)
Creatinine: 0.8 mg/dL (ref 0.6–1.1)
EGFR: 77 mL/min/{1.73_m2} — AB (ref >90)
Glucose: 70 mg/dl (ref 70–140)
POTASSIUM: 3.4 meq/L — AB (ref 3.5–5.1)
SODIUM: 144 meq/L (ref 136–145)
Total Bilirubin: 1.52 mg/dL — ABNORMAL HIGH (ref 0.20–1.20)
Total Protein: 6.8 g/dL (ref 6.4–8.3)

## 2015-03-12 LAB — GLUCOSE, CAPILLARY: GLUCOSE-CAPILLARY: 79 mg/dL (ref 65–99)

## 2015-03-12 MED ORDER — FLUDEOXYGLUCOSE F - 18 (FDG) INJECTION
8.8900 | Freq: Once | INTRAVENOUS | Status: AC | PRN
  Administered 2015-03-12: 8.89 via INTRAVENOUS

## 2015-03-13 ENCOUNTER — Other Ambulatory Visit: Payer: Self-pay | Admitting: Radiation Therapy

## 2015-03-13 DIAGNOSIS — C7931 Secondary malignant neoplasm of brain: Secondary | ICD-10-CM

## 2015-03-13 DIAGNOSIS — C7949 Secondary malignant neoplasm of other parts of nervous system: Principal | ICD-10-CM

## 2015-03-18 ENCOUNTER — Ambulatory Visit (HOSPITAL_BASED_OUTPATIENT_CLINIC_OR_DEPARTMENT_OTHER): Payer: BLUE CROSS/BLUE SHIELD | Admitting: Oncology

## 2015-03-18 ENCOUNTER — Telehealth: Payer: Self-pay | Admitting: Oncology

## 2015-03-18 VITALS — BP 189/98 | HR 75 | Temp 98.4°F | Resp 18 | Ht 65.0 in | Wt 189.3 lb

## 2015-03-18 DIAGNOSIS — C7931 Secondary malignant neoplasm of brain: Secondary | ICD-10-CM

## 2015-03-18 DIAGNOSIS — C439 Malignant melanoma of skin, unspecified: Secondary | ICD-10-CM | POA: Diagnosis not present

## 2015-03-18 MED ORDER — DEXAMETHASONE 2 MG PO TABS: 2.0000 mg | ORAL_TABLET | Freq: Every day | ORAL | Status: DC

## 2015-03-18 NOTE — Progress Notes (Signed)
Hematology and Oncology Follow Up Visit  Jill Chavez [id number] November 05, 1950 65 y.o. 03/18/2015 9:48 AM   Principle Diagnosis: 65 year old woman diagnosed with metastatic melanoma after presenting with large frontal lobe brain lesion in April 2016. She has no primary tumor identified and no evidence of other systemic metastasis. 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   Prior Therapy: She is status post craniotomy and resection of a right frontal hemorrhagic mass done on 07/02/2014. The pathology revealed a metastatic melanoma. She is status post SRS to 5 other sites of brain metastasis completed on 07/23/2014, 07/25/2014 and 07/28/2014.   Current therapy:  Zelboraf 960 mg daily twice a day started on 12/24/2014. This was changed to 720 mg twice a day in November 2016 for better tolerance. Dexamethasone 4 mg tablets twice a day starting on 01/27/2015 and the dose was reduced again to 4 mg daily in 02/12/2015.   Interim History: Jill Chavez presents today for a follow-up visit. Since the last visit, she reports doing fairly well. She is starting to develop lower extremity edema and facial swelling related to prednisone. She was was evaluated by Dr. Valetta Close from ophthalmology and was noted to have stable papilledema bilaterally compared to previous examination and appears to be overall stable vision. She has been doing fairly well on dexamethasone 4 mg daily and does not report any other side effects.  She does not report any  no neurological symptoms such as headaches, blurry vision, falls or seizures. She is able to ambulate without any difficulties. Her appetite is excellent. She is having some more troubles with his steroids predominantly with fluid retention. She does have hoarseness in the voice associated   She continues to tolerate Zelboraf without any major complications.She has reported  occasional skin rash and overall her subcutaneous nodule in the scalp  have improved.  She does not report any fevers, chills, sweats. She does not report any chest pain, palpitation orthopnea. She does not report any cough or hemoptysis or hematemesis. She does not report any nausea, vomiting, abdominal pain. She does not report any hematochezia or melon a. She does not report any frequency, urgency or hesitancy. She does not report any skeletal complaints. Remaining review of systems unremarkable.  Medications: I have reviewed the patient's current medications.  Current Outpatient Prescriptions  Medication Sig Dispense Refill  . levothyroxine (SYNTHROID, LEVOTHROID) 125 MCG tablet Take 125 mcg by mouth daily before breakfast.     . ondansetron (ZOFRAN) 4 MG tablet Take 1 tablet (4 mg total) by mouth every 4 (four) hours as needed for nausea or vomiting. 20 tablet 0  . prochlorperazine (COMPAZINE) 10 MG tablet Take 1 tablet (10 mg total) by mouth every 6 (six) hours as needed for nausea or vomiting. 30 tablet 0  . vemurafenib (ZELBORAF) 240 MG tablet Take 4 tablets (960 mg total) by mouth every 12 (twelve) hours. Take with water. (Patient taking differently: Take 960 mg by mouth every 12 (twelve) hours. Take with water. Patient only takes 3 tablets daily) 240 tablet 0  . dexamethasone (DECADRON) 2 MG tablet Take 1 tablet (2 mg total) by mouth daily. 40 tablet 1   No current facility-administered medications for this visit.     Allergies: No Known Allergies  Past Medical History, Surgical history, Social history, and Family History were reviewed and updated.   Physical Exam: Blood pressure 189/98, pulse 75, temperature 98.4 F (36.9 C), temperature source Oral, resp. rate 18, height 5' 5"  (1.651  m), weight 189 lb 4.8 oz (85.866 kg), SpO2 98 %. ECOG: 1 General appearance: alert and cooperative  well-appearing without distress.  Head: Normocephalic, without obvious abnormality no thrush noted at this time.  Neck: no adenopathy. Fat pads noted supraclavicular  area bilaterally. Lymph nodes: Cervical, supraclavicular, and axillary nodes normal. Heart:regular rate and rhythm, S1, S2 normal, no murmur, click, rub or gallop Lung:chest clear, no wheezing, rales, normal symmetric air entry Abdomin: soft, non-tender, without masses or organomegaly or shifting dullness or ascites.  EXT:no erythema, induration, or nodules Skin: Continues decrease of her skin nodules noted on the temporal area as well as forehead.    Lab Results: Lab Results  Component Value Date   WBC 2.8* 03/12/2015   HGB 12.3 03/12/2015   HCT 37.8 03/12/2015   MCV 89.8 03/12/2015   PLT 171 03/12/2015     Chemistry      Component Value Date/Time   NA 144 03/12/2015 1129   NA 141 11/11/2014 1243   K 3.4* 03/12/2015 1129   K 3.7 11/11/2014 1243   CL 108 11/11/2014 1243   CO2 26 03/12/2015 1129   CO2 28 11/11/2014 1243   BUN 11.9 03/12/2015 1129   BUN 8 11/11/2014 1243   CREATININE 0.8 03/12/2015 1129   CREATININE 0.62 11/11/2014 1243      Component Value Date/Time   CALCIUM 9.6 03/12/2015 1129   CALCIUM 10.8* 11/11/2014 1243   ALKPHOS 66 03/12/2015 1129   ALKPHOS 63 06/30/2014 0235   AST 17 03/12/2015 1129   AST 19 06/30/2014 0235   ALT 44 03/12/2015 1129   ALT 12 06/30/2014 0235   BILITOT 1.52* 03/12/2015 1129   BILITOT 0.8 06/30/2014 0235     FINDINGS: Head/Neck: 1.9 cm calcified hemangioma in the anterior vertex (series 4/image 15).  Multiple hyperdense/ hemorrhagic metastases (for example, series 4/ images 23, 27, and 31), better evaluated on MRI. Hypodensity with associated hypometabolism in the right frontal region (series 4/ image 34; PET image 17), corresponding to vasogenic edema/hemorrhagic infarct related to known dominant right frontal metastasis.  12 x 6 mm lesion overlying the left frontal scalp (series 4/ image 30), max SUV 2.7, previously 9.3.  4 mm short axis level IIa node along the angle of the left mandible (series 4/image 50), max  SUV 5.3, previously 3.5. Additional small bilateral cervical nodes (levels Ib, IIa, and IIB) with mild hypermetabolism are new (PET images 39, 44, and 48).  Chest: Visualized lungs are clear. No focal consolidation. No pleural effusion or pneumothorax.  Cardiomegaly. No pericardial effusion. Mild coronary atherosclerosis in the LAD.  No hypermetabolic thoracic lymphadenopathy.  Abdomen/Pelvis: No abnormal hypermetabolic activity within the liver, pancreas, adrenal glands, or spleen.  4.5 cm lateral left lower pole renal cyst. Atherosclerotic calcifications of the abdominal aorta.  No hypermetabolic lymph nodes in the abdomen or pelvis.  Skeleton: No focal hypermetabolic activity to suggest skeletal metastasis.  Extremities: No hypermetabolic activity to suggest metastasis.  Mild hypermetabolism along the lateral aspect of the right forefoot, max SUV 2.8, but without CT abnormality to suggest a true cutaneous lesion.  IMPRESSION: Known multifocal brain metastases, better evaluated on prior MRI.  Improving left frontal scalp metastasis.  Suspected mild progression of cervical nodal metastases, as above.   Impression and Plan:  65 year old woman with the following issues:  1. Metastatic melanoma presenting with a brain metastasis with her initial CT scan chest abdomen and pelvis obtained on 06/29/2014 did not show any measurable disease. Her tumor was  found to be BRAF mutated  PET CT scan on 10/21/2014 showed enlarging subcutaneous nodules.   She is on Zelboraf and have tolerated it very well so far. Her scalp nodules are stable and may be decreasing in the size of the lesion on the left temple. She has tolerated this medication better at 720 mg twice a day.    PET CT scan on 03/12/2015 was reviewed today and showed for the most part improving scalp metastasis. She did have a slight progression of her cervical node and will need to be monitored for future  visits.    2. Brain metastasis: She is status post surgical resection followed by [facility]. Last MRI done on 01/27/2015 which showed for the most part stable disease and slight decrease in the majority of her brain metastasis.   I recommended continued dexamethasone to be reduced to 2 mg daily and continue very slow taper. I'll keep her 2 mg daily for the next 4 weeks and to taper her to 1 mg after that.    3. Dermatology surveillance: She is currently following up with dermatology a regular basis.  4. Cardiovascular surveillance: She did have an EKG and had a normal QTc interval. Periodic monitoring will be needed on this current medication.  5. Bilateral papilledema noted on examination by Dr. Valetta Close. Her repeat MRI did not show major changes at this time. Follow-up examination on 03/17/2015 showed stable papilledema without any major changes. This will continue to be monitored and has a follow-up with ophthalmology in 6 weeks.   6. Hypercalcemia: Likely related to malignancy and possible bone metastasis. She is status post Zometa in October 2016. Her calcium was within normal range on 03/12/2015  7. Follow-up: Will be in 4 weeks.   [facility], MD 1/4/20179:48 AM

## 2015-03-18 NOTE — Telephone Encounter (Signed)
Gave pt appt for 04/21/15.

## 2015-03-19 ENCOUNTER — Telehealth: Payer: Self-pay | Admitting: Pharmacist

## 2015-03-19 NOTE — Telephone Encounter (Signed)
03/19/15: Attempted to call patient regarding new oral medication: Vemurafenib. No Answer. Left VM for patient to call back with any questions or concerns.   Thank you,  Montel Clock, PharmD, [facility] [phone]

## 2015-03-26 ENCOUNTER — Other Ambulatory Visit: Payer: Self-pay | Admitting: Oncology

## 2015-03-26 DIAGNOSIS — C439 Malignant melanoma of skin, unspecified: Secondary | ICD-10-CM

## 2015-03-26 DIAGNOSIS — C7931 Secondary malignant neoplasm of brain: Secondary | ICD-10-CM

## 2015-03-26 MED ORDER — VEMURAFENIB 240 MG PO TABS: 720.0000 mg | ORAL_TABLET | Freq: Two times a day (BID) | ORAL | Status: AC

## 2015-03-26 MED ORDER — VEMURAFENIB 240 MG PO TABS: 720.0000 mg | ORAL_TABLET | Freq: Two times a day (BID) | ORAL | Status: DC

## 2015-03-31 ENCOUNTER — Encounter: Payer: Self-pay | Admitting: Oncology

## 2015-03-31 ENCOUNTER — Encounter: Payer: Self-pay | Admitting: *Deleted

## 2015-03-31 NOTE — Progress Notes (Signed)
Form from [facility], needing information for [employer] for oral drugs for metastatic melanoma put in raquel browning's box in managed care.

## 2015-03-31 NOTE — Progress Notes (Signed)
I placed bcbs form on desk for dr Alen Blew.[name] Kitchen

## 2015-04-01 ENCOUNTER — Other Ambulatory Visit: Payer: Self-pay | Admitting: Radiation Therapy

## 2015-04-01 DIAGNOSIS — C7949 Secondary malignant neoplasm of other parts of nervous system: Principal | ICD-10-CM

## 2015-04-01 DIAGNOSIS — C7931 Secondary malignant neoplasm of brain: Secondary | ICD-10-CM

## 2015-04-03 ENCOUNTER — Encounter: Payer: Self-pay | Admitting: Pharmacist

## 2015-04-03 NOTE — Progress Notes (Signed)
04/03/15: pt with new insurance. Zelboraf now filled through [facility]. With [employer] card her copay is $25. Pt will receive on Monday 04/06/15. Biologics shipped out today.   Thank you,  Montel Clock, PharmD, [facility]

## 2015-04-06 ENCOUNTER — Ambulatory Visit
Admission: RE | Admit: 2015-04-06 | Discharge: 2015-04-06 | Disposition: A | Discharge status: Home / Self Care | Payer: BLUE CROSS/BLUE SHIELD | Source: Ambulatory Visit | Attending: Radiation Oncology | Admitting: Radiation Oncology

## 2015-04-06 DIAGNOSIS — C7931 Secondary malignant neoplasm of brain: Secondary | ICD-10-CM

## 2015-04-06 DIAGNOSIS — C7949 Secondary malignant neoplasm of other parts of nervous system: Secondary | ICD-10-CM

## 2015-04-06 LAB — CBC WITH DIFFERENTIAL/PLATELET
BASO%: 0.2 % (ref 0.0–2.0)
BASOS ABS: 0 10*3/uL (ref 0.0–0.1)
EOS ABS: 0 10*3/uL (ref 0.0–0.5)
EOS%: 0.3 % (ref 0.0–7.0)
HCT: 37.2 % (ref 34.8–46.6)
HGB: 12.1 g/dL (ref 11.6–15.9)
LYMPH%: 15.5 % (ref 14.0–49.7)
MCH: 28.5 pg (ref 25.1–34.0)
MCHC: 32.5 g/dL (ref 31.5–36.0)
MCV: 87.7 fL (ref 79.5–101.0)
MONO#: 0.6 10*3/uL (ref 0.1–0.9)
MONO%: 6.4 % (ref 0.0–14.0)
NEUT#: 6.9 10*3/uL — ABNORMAL HIGH (ref 1.5–6.5)
NEUT%: 77.6 % — AB (ref 38.4–76.8)
PLATELETS: 214 10*3/uL (ref 145–400)
RBC: 4.24 10*6/uL (ref 3.70–5.45)
RDW: 15.2 % — ABNORMAL HIGH (ref 11.2–14.5)
WBC: 8.9 10*3/uL (ref 3.9–10.3)
lymph#: 1.4 10*3/uL (ref 0.9–3.3)

## 2015-04-06 LAB — COMPREHENSIVE METABOLIC PANEL
ALT: 76 U/L — AB (ref 0–55)
AST: 27 U/L (ref 5–34)
Albumin: 3.9 g/dL (ref 3.5–5.0)
Alkaline Phosphatase: 76 U/L (ref 40–150)
Anion Gap: 9 mEq/L (ref 3–11)
BUN: 14.3 mg/dL (ref 7.0–26.0)
CALCIUM: 10.2 mg/dL (ref 8.4–10.4)
CHLORIDE: 109 meq/L (ref 98–109)
CO2: 24 mEq/L (ref 22–29)
CREATININE: 0.8 mg/dL (ref 0.6–1.1)
EGFR: 74 mL/min/{1.73_m2} — ABNORMAL LOW (ref >90)
GLUCOSE: 122 mg/dL (ref 70–140)
Potassium: 3.8 mEq/L (ref 3.5–5.1)
SODIUM: 142 meq/L (ref 136–145)
Total Bilirubin: 1.02 mg/dL (ref 0.20–1.20)
Total Protein: 7.2 g/dL (ref 6.4–8.3)

## 2015-04-06 MED ORDER — GADOBENATE DIMEGLUMINE 529 MG/ML IV SOLN
17.0000 mL | Freq: Once | INTRAVENOUS | Status: AC | PRN
  Administered 2015-04-06: 17 mL via INTRAVENOUS

## 2015-04-07 NOTE — Progress Notes (Signed)
Radiation Oncology         [phone] ________________________________  Name: Jill Chavez MRN: [id number]  Date: 04/08/2015  DOB: 1950-05-17  Follow-Up Visit Note  Outpatient  CC: Haywood Pao, MD  Tisovec, Fransico Him, MD  Diagnosis and Prior Radiotherapy:    ICD-9-CM ICD-10-CM   1. Brain metastasis ([place]) 198.3 C79.31 fluconazole (DIFLUCAN) 100 MG tablet     Radiation treatment dates:   07/23/2014, 07/25/2014, 07/28/2014  Site/Dose/Beams:   5 sites were treated in the brain with stereotactic radiosurgery.  PTV1 Right frontal 50mm tumor cavity target was treated using 5 Rapid Arc VMAT Beams to a prescription dose of 24 GY in 3 fractions.  PTV's 2-5 were DCA's. 4 table angles were used with a single isocenter: PTV2 R insular 13mm received 18 Gy,  PTV3 L Lat Temporal 47mm received 20 Gy,  PTV4 R Cerebellar 73mm received 20 Gy,  PTV5 L Occipital 69mm lesion received 20 Gy  Narrative:  The patient returns today for routine follow-up.  She is here to have a recent MRI read which was moved up due to slurred speech that comes and goes. No cognitive changes. Her mobility is good, and she reports her appetite is good. She is on decadron 2 mg daily. No recent seizures. No HAs. She denies any nausea or vomiting. She does admit to some fatigue and takes a daily nap.   Mixed response to systemic therapy per 03-12-15 PET.  Brain MRI this week shows numerous new metastases. Reviewed at CNS tumor board, group recommending WBRT vs hospice.   ALLERGIES:  has No Known Allergies.  Meds: Current Outpatient Prescriptions  Medication Sig Dispense Refill  . dexamethasone (DECADRON) 2 MG tablet Take 1 tablet (2 mg total) by mouth daily. 40 tablet 1  . levothyroxine (SYNTHROID, LEVOTHROID) 125 MCG tablet Take 125 mcg by mouth daily before breakfast.     . vemurafenib (ZELBORAF) 240 MG tablet Take 3 tablets (720 mg total) by mouth every 12 (twelve) hours. Take with water. 180 tablet 0  . fluconazole  (DIFLUCAN) 100 MG tablet Take 2 tablets today, then 1 tablet daily x 20 more days. 22 tablet 0  . ondansetron (ZOFRAN) 4 MG tablet Take 1 tablet (4 mg total) by mouth every 4 (four) hours as needed for nausea or vomiting. (Patient not taking: Reported on 04/08/2015) 20 tablet 0  . prochlorperazine (COMPAZINE) 10 MG tablet Take 1 tablet (10 mg total) by mouth every 6 (six) hours as needed for nausea or vomiting. (Patient not taking: Reported on 04/08/2015) 30 tablet 0   No current facility-administered medications for this encounter.    Physical Findings: The patient is in no acute distress.   height is 5\' 5"  (1.651 m) and weight is 190 lb 4.8 oz (86.32 kg). Her temperature is 98.3 F (36.8 C). Her blood pressure is 194/91 and her pulse is 75. Her oxygen saturation is 98%. .    Neurologic exam unremarkable, no focality. + oral thrush.  Mass appreciated at anterior floor of mouth with candidiasis - either benign bone or floor of mouth tumor. Patient is alert and oriented. Strength 5-/5 in all extremities. No numbness in extremities. KPS 80   Lab Findings: Lab Results  Component Value Date   WBC 8.9 04/06/2015   HGB 12.1 04/06/2015   HCT 37.2 04/06/2015   MCV 87.7 04/06/2015   PLT 214 04/06/2015    Radiographic Findings: Mr [name] [id number] Contrast  04/06/2015  ADDENDUM REPORT: 04/06/2015 16:17 ADDENDUM:  Study discussed by telephone with Dr. Judson Roch Georgann Bramble on 04/06/2015 at 1609 hours. Electronically Signed   By: Genevie Ann M.D.   On: 04/06/2015 16:17  04/06/2015  CLINICAL DATA:  65 year old female with metastatic melanoma. Right frontal lobe tumor resection. 5 sites were treated in the brain with stereotactic radiosurgery in May. Progression suggested in September. Restaging. Subsequent encounter. EXAM: MRI HEAD WITHOUT AND WITH CONTRAST TECHNIQUE: Multiplanar, multiecho pulse sequences of the brain and surrounding structures were obtained without and with intravenous contrast. CONTRAST:  54mL MULTIHANCE  GADOBENATE DIMEGLUMINE 529 MG/ML IV SOLN COMPARISON:  01/27/2015, 12/12/2014 and earlier. FINDINGS: For today's study on series 10, NEW metastases are annotated with SINGLE arrows (most but not all new metastasis are annotated). Enlarged PREEXISTING lesions are annotated with DOUBLE arrows. Greater than 30 new enhancing cerebral metastases scattered throughout both cerebral hemispheres. The deep gray matter nuclei, brainstem, and cerebellum are spared. Most new metastases measure 5-10 mm diameter, but the largest new metastases are up to 22 mm diameter. New subependymal metastatic disease is confluent along the left temporal horn and also at the body of the left lateral ventricle. Small subependymal metastasis also at the left frontal horn. 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 There is a new right parasagittal dural based metastasis measuring 18 mm diameter just across midline from the meningioma. Previously-seen right hippocampal tail metastasis has markedly progressed from less than 10 mm previously 8 now to nearly 20 mm diameter. Most of the new metastases are hemorrhagic. The large anterior right frontal lobe hemorrhagic metastasis has decreased in overall size since September which appears to reflect some interval resolution of hematoma rather than significant improvement in the metastatic tumor. There is widespread new bilateral cerebral edema, although there is only trace leftward midline shift at the anterior septum callosum, and basilar cisterns remain patent. No ventriculomegaly. No intraventricular hemorrhage. No extra-axial hemorrhage. Major intracranial vascular flow voids are stable. Stable and negative visualized cervical spine. Visible bone marrow signal remains within normal limits. Left superior scalp metastasis has regressed since September (series 10, image 120). Left parotid space lymphadenopathy has resolved. IMPRESSION: 1. Marked progression of cerebral  metastatic disease, greater than 30 new brain metastases, most 5-10 mm diameter but up to 22 mm. This includes new subependymal metastatic disease along the left lateral ventricle. 2. Associated increased areas of cerebral edema, but no significant intracranial mass effect at this time. 3. Hemorrhage component of the large anterior right frontal lobe hemorrhagic metastasis has regressed since September. 4. Left scalp metastasis and parotid space lymphadenopathy has regressed. Electronically Signed: By: Genevie Ann M.D. On: 04/06/2015 16:02   Nm Pet Image Restage (ps) Whole Body  03/12/2015  CLINICAL DATA:  Subsequent treatment strategy for melanoma. Brain metastases. EXAM: NUCLEAR MEDICINE PET WHOLE BODY TECHNIQUE: 8.9 mCi F-18 FDG was injected intravenously. Full-ring PET imaging was performed from the vertex to the feet after the radiotracer. CT data was obtained and used for attenuation correction and anatomic localization. FASTING BLOOD GLUCOSE:  Value:  79 mg/dl COMPARISON:  PET-CT dated 10/21/2014.  MRI brain dated 01/27/2015. FINDINGS: Head/Neck: 1.9 cm calcified hemangioma in the anterior vertex (series 4/image 15). Multiple hyperdense/ hemorrhagic metastases (for example, series 4/ images 23, 27, and 31), better evaluated on MRI. Hypodensity with associated hypometabolism in the right frontal region (series 4/ image 34; PET image 17), corresponding to vasogenic edema/hemorrhagic infarct related to known dominant right frontal metastasis. 12 x 6 mm lesion overlying the left frontal scalp (  series 4/ image 30), max SUV 2.7, previously 9.3. 4 mm short axis level IIa node along the angle of the left mandible (series 4/image 50), max SUV 5.3, previously 3.5. Additional small bilateral cervical nodes (levels Ib, IIa, and IIB) with mild hypermetabolism are new (PET images 39, 44, and 48). Chest: Visualized lungs are clear. No focal consolidation. No pleural effusion or pneumothorax. Cardiomegaly. No pericardial  effusion. Mild coronary atherosclerosis in the LAD. No hypermetabolic thoracic lymphadenopathy. Abdomen/Pelvis: No abnormal hypermetabolic activity within the liver, pancreas, adrenal glands, or spleen. 4.5 cm lateral left lower pole renal cyst. Atherosclerotic calcifications of the abdominal aorta. No hypermetabolic lymph nodes in the abdomen or pelvis. Skeleton: No focal hypermetabolic activity to suggest skeletal metastasis. Extremities: No hypermetabolic activity to suggest metastasis. Mild hypermetabolism along the lateral aspect of the right forefoot, max SUV 2.8, but without CT abnormality to suggest a true cutaneous lesion. IMPRESSION: Known multifocal brain metastases, better evaluated on prior MRI. Improving left frontal scalp metastasis. Suspected mild progression of cervical nodal metastases, as above. Electronically Signed   By: Julian Hy M.D.   On: 03/12/2015 12:22    Impression/Plan: Lucely's Brain MRI unfortunately shows numerous mets.  She is not very symptomatic.  SRS is not a good option given the extent of her disease.  I told her and her family that I think whole brain RT would do more harm than good.  I am having her see palliative care on Monday. [name], RT will try to move up her appt with Dr Alen Blew, to see if any other systemic agents might penetrate the brain.  I'll see her back PRN, and let Dr Alen Blew decide if more scanning of the brain is warranted should she stay on systemic treatments. Otherwise, hospice is reasonable.  She understands her prognosis is not good, and her life expectancy is a [employer] of months, rather than years.  She seems to be taking this difficult news rather well, but may be in some element of shock.  I appreciate palliative care seeing her soon.  Fluconazole for thrush today.  Mass appreciated at anterior floor of mouth - either benign bone or floor of mouth tumor. Asymptomatic, do not think work up is necessary.     _____________________________________   Eppie Gibson, MD

## 2015-04-08 ENCOUNTER — Ambulatory Visit
Admission: RE | Admit: 2015-04-08 | Discharge: 2015-04-08 | Disposition: A | Discharge status: Home / Self Care | Payer: BLUE CROSS/BLUE SHIELD | Source: Ambulatory Visit | Attending: Radiation Oncology | Admitting: Radiation Oncology

## 2015-04-08 ENCOUNTER — Encounter: Payer: Self-pay | Admitting: Radiation Oncology

## 2015-04-08 ENCOUNTER — Telehealth: Payer: Self-pay | Admitting: Oncology

## 2015-04-08 VITALS — BP 194/91 | HR 75 | Temp 98.3°F | Ht 65.0 in | Wt 190.3 lb

## 2015-04-08 DIAGNOSIS — C7931 Secondary malignant neoplasm of brain: Secondary | ICD-10-CM

## 2015-04-08 MED ORDER — FLUCONAZOLE 100 MG PO TABS: ORAL_TABLET | ORAL | Status: AC

## 2015-04-08 NOTE — Telephone Encounter (Signed)
Called in prescription per Dr. Isidore Moos to [facility] for fluconazole (DIFLUCAN) 100 MG tablet - Take 2 tablets today, then 1 tablet daily x 20 more days.  Disp. 22 tablets with 0 refills.

## 2015-04-08 NOTE — Progress Notes (Addendum)
Jill Chavez is here for follow up of radiation completed 07/28/14 to her Brain Metastases. She is here to have a recent MRI read which was moved up due to slurred speech. Her mobility is good, and she reports her appetite is good. She is on decadron 2 mg daily. She denies any nausea or vomiting. She does admit to some fatigue and takes a daily nap.   BP 194/91 mmHg  Pulse 75  Temp(Src) 98.3 F (36.8 C)  Ht 5\' 5"  (1.651 m)  Wt 190 lb 4.8 oz (86.32 kg)  BMI 31.67 kg/m2  SpO2 98%   Wt Readings from Last 3 Encounters:  04/08/15 190 lb 4.8 oz (86.32 kg)  03/18/15 189 lb 4.8 oz (85.866 kg)  02/12/15 178 lb 9.6 oz (81.012 kg)

## 2015-04-13 ENCOUNTER — Ambulatory Visit
Admission: RE | Admit: 2015-04-13 | Discharge: 2015-04-13 | Disposition: A | Discharge status: Home / Self Care | Payer: BLUE CROSS/BLUE SHIELD | Source: Ambulatory Visit | Attending: Radiation Oncology | Admitting: Radiation Oncology

## 2015-04-13 ENCOUNTER — Other Ambulatory Visit: Payer: Self-pay | Admitting: Oncology

## 2015-04-13 ENCOUNTER — Telehealth: Payer: Self-pay | Admitting: Oncology

## 2015-04-13 ENCOUNTER — Encounter: Payer: Self-pay | Admitting: *Deleted

## 2015-04-13 ENCOUNTER — Encounter: Payer: Self-pay | Admitting: Oncology

## 2015-04-13 DIAGNOSIS — C7931 Secondary malignant neoplasm of brain: Secondary | ICD-10-CM | POA: Diagnosis not present

## 2015-04-13 DIAGNOSIS — Z7189 Other specified counseling: Secondary | ICD-10-CM

## 2015-04-13 DIAGNOSIS — Z515 Encounter for palliative care: Secondary | ICD-10-CM | POA: Diagnosis not present

## 2015-04-13 NOTE — Telephone Encounter (Signed)
Lft msg for pt confirming labs/ov per 01/30 POF r/s from 02/07 apt... KJ

## 2015-04-13 NOTE — Consult Note (Signed)
Consultation Note Date: 04/13/2015   Patient Name: Jill Chavez  DOB: Sep 06, 1950  MRN: [id number]  Age / Sex: 65 y.o., female  PCP: Haywood Pao, MD Referring Physician: Eppie Gibson, MD  Reason for Consultation: Establishing goals of care and Psychosocial/spiritual support  Introduction to Palliative Medicine as part of holistic care plan  Clinical Assessment/Narrative:   Principle Diagnosis: 65 year old woman diagnosed with metastatic melanoma after presenting with large frontal lobe brain lesion in April 2016. 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   Prior Therapy: She is status post craniotomy and resection of a right frontal hemorrhagic mass done on 07/02/2014. The pathology revealed a metastatic melanoma. She is status post SRS to 5 other sites of brain metastasis completed on 07/23/2014, 07/25/2014 and 07/28/2014. January 2017 a brain MRI showed marked progression of metastatic disease with over 30 brain metastasis. Over the past month she has had balance difficulties and worsening of her speech.   Consult is for review of medical treatment options, clarification of goals of care and end of life issues, disposition and options, and symptom recommendation.  This NP Wadie Lessen reviewed medical records, received report from team, assessed the patient and then meet at the patient's bedside along with her daughter Chryl Heck # [phone] and son [name] Murpphy # [phone]  to discuss diagnosis, prognosis, GOC, EOL wishes disposition and options.   A detailed discussion was had today regarding advanced directives.  Concepts specific to code status, artifical feeding and hydration, continued IV antibiotics and rehospitalization was had.  The difference between a aggressive medical intervention path  and a palliative comfort care path for this patient at this time was  had.  Values and goals of care important to patient and family were attempted to be elicited.  Concept of Hospice and Palliative Care were discussed.   Importance of HPOA and Advanced directives stressed, contacted SW in cancer center and we will be able to document and notarize today  Natural trajectory and expectations at EOL were discussed.  Questions and concerns addressed.   Family encouraged to call with questions or concerns.  PMT will continue to support holistically.   HCPOA: yes     Documented today with SW in [place] for "many more good days", open to all offered and available medical interventions to prolong life  Code Status/Advance Care Planning:  DNR   Code Status History    Date Active Date Inactive Code Status Order ID Comments User Context   07/02/2014  8:50 PM 07/05/2014  3:48 PM Full Code [id number]  Kary Kos, MD Inpatient   06/29/2014  2:43 AM 07/02/2014  8:50 PM Full Code [id number]  Phillips Grout, MD Inpatient      Other Directives:Advanced Directive and Living Will  Symptom Management:  Fatigue: -Pace yourself -Plan your day -Include naps and breaks -schedule a relaxing day -get a little exercise -fuel the body -consider complementary therapies   -deep breathing   -prayer/medication    -guided meditation  Palliative Prophylaxis:   Bowel Regimen   Psycho-social/Spiritual:  Support System: Strong  Additional Recommendations: Education on Hospice    Chief Complaint/ Primary Diagnoses: Present on Admission:  **None**  I have reviewed the medical record, interviewed the patient and family, and examined the patient. The following aspects are pertinent.  Past Medical History  Diagnosis Date  . Arthritis   . Hypothyroid   . GERD (gastroesophageal reflux  disease)   . Brain metastasis ([place])   . S/P radiation therapy 06/02/14-07/14/14    RLL lung 60Gy/51f  . S/P radiation therapy  07/23/14;07/25/14;07/28/14    Brain   Social History   Social History  . Marital Status: Married    Spouse Name: N/A  . Number of Children: N/A  . Years of Education: N/A   Social History Main Topics  . Smoking status: Never Smoker   . Smokeless tobacco: Not on file  . Alcohol Use: No  . Drug Use: No  . Sexual Activity: Not on file   Other Topics Concern  . Not on file   Social History Narrative   No family history on file. Scheduled Meds: Continuous Infusions: PRN Meds:. Medications Prior to Admission:  Prior to Admission medications   Medication Sig Start Date End Date Taking? Authorizing Provider  dexamethasone (DECADRON) 2 MG tablet Take 1 tablet (2 mg total) by mouth daily. 03/18/15   FWyatt Portela MD  fluconazole (DIFLUCAN) 100 MG tablet Take 2 tablets today, then 1 tablet daily x 20 more days. 04/08/15   SEppie Gibson MD  levothyroxine (SYNTHROID, LEVOTHROID) 125 MCG tablet Take 125 mcg by mouth daily before breakfast.  06/09/14   Historical Provider, MD  ondansetron (ZOFRAN) 4 MG tablet Take 1 tablet (4 mg total) by mouth every 4 (four) hours as needed for nausea or vomiting. Patient not taking: Reported on 04/08/2015 07/05/14   SJonetta Osgood MD  prochlorperazine (COMPAZINE) 10 MG tablet Take 1 tablet (10 mg total) by mouth every 6 (six) hours as needed for nausea or vomiting. Patient not taking: Reported on 04/08/2015 12/24/14   FWyatt Portela MD  vemurafenib (ZELBORAF) 240 MG tablet Take 3 tablets (720 mg total) by mouth every 12 (twelve) hours. Take with water. 03/26/15   FWyatt Portela MD   No Known Allergies  Review of Systems  Constitutional: Positive for activity change and fatigue.  Respiratory: Negative.   Neurological: Positive for speech difficulty.    Physical Exam  Vital Signs: There were no vitals taken for this visit.  SpO2:   O2 Device:  O2 Flow Rate: .   IO: Intake/output summary: No intake or output data in the 24 hours ending 04/13/15  1634  LBM:   Baseline Weight:   Most recent weight:        Palliative Assessment/Data:    Additional Data Reviewed:  CBC:    Component Value Date/Time   WBC 8.9 04/06/2015 1216   WBC 8.0 11/11/2014 1243   HGB 12.1 04/06/2015 1216   HGB 12.0 11/11/2014 1243   HCT 37.2 04/06/2015 1216   HCT 37.3 11/11/2014 1243   PLT 214 04/06/2015 1216   PLT 204 11/11/2014 1243   MCV 87.7 04/06/2015 1216   MCV 88.0 11/11/2014 1243   NEUTROABS 6.9* 04/06/2015 1216   NEUTROABS 5.5 11/11/2014 1243   LYMPHSABS 1.4 04/06/2015 1216   LYMPHSABS 1.8 11/11/2014 1243   MONOABS 0.6 04/06/2015 1216   MONOABS 0.6 11/11/2014 1243   EOSABS 0.0 04/06/2015 1216   EOSABS 0.1 11/11/2014 1243   BASOSABS 0.0 04/06/2015 1216   BASOSABS 0.0 11/11/2014 1243   Comprehensive Metabolic Panel:    Component Value Date/Time   NA 142 04/06/2015 1216   NA 141 11/11/2014 1243   K 3.8 04/06/2015 1216   K 3.7 11/11/2014 1243   CL 108 11/11/2014 1243   CO2 24 04/06/2015 1216   CO2 28 11/11/2014 1243   BUN 14.3  04/06/2015 1216   BUN 8 11/11/2014 1243   CREATININE 0.8 04/06/2015 1216   CREATININE 0.62 11/11/2014 1243   GLUCOSE 122 04/06/2015 1216   GLUCOSE 103* 11/11/2014 1243   CALCIUM 10.2 04/06/2015 1216   CALCIUM 10.8* 11/11/2014 1243   AST 27 04/06/2015 1216   AST 19 06/30/2014 0235   ALT 76* 04/06/2015 1216   ALT 12 06/30/2014 0235   ALKPHOS 76 04/06/2015 1216   ALKPHOS 63 06/30/2014 0235   BILITOT 1.02 04/06/2015 1216   BILITOT 0.8 06/30/2014 0235   PROT 7.2 04/06/2015 1216   PROT 7.3 06/30/2014 0235   ALBUMIN 3.9 04/06/2015 1216   ALBUMIN 4.0 06/30/2014 0235   Discussed with Dr Tammi Klippel  Time In: 0900 Time Out: 1015 Time Total: 75 min Greater than 50%  of this time was spent counseling and coordinating care related to the above assessment and plan.  Signed by: Wadie Lessen, NP  Knox Royalty, NP  04/13/2015, 4:34 PM  Please contact Palliative Medicine Team phone at [phone] for questions  and concerns.

## 2015-04-13 NOTE — Progress Notes (Signed)
Per [facility] zelboraf approved [id number] I sent to medical records

## 2015-04-14 ENCOUNTER — Telehealth: Payer: Self-pay | Admitting: Oncology

## 2015-04-14 ENCOUNTER — Other Ambulatory Visit (HOSPITAL_BASED_OUTPATIENT_CLINIC_OR_DEPARTMENT_OTHER): Payer: BLUE CROSS/BLUE SHIELD

## 2015-04-14 ENCOUNTER — Ambulatory Visit (HOSPITAL_BASED_OUTPATIENT_CLINIC_OR_DEPARTMENT_OTHER): Payer: BLUE CROSS/BLUE SHIELD | Admitting: Oncology

## 2015-04-14 VITALS — BP 192/107 | HR 80 | Temp 98.8°F | Resp 18 | Wt 190.5 lb

## 2015-04-14 DIAGNOSIS — C7931 Secondary malignant neoplasm of brain: Secondary | ICD-10-CM

## 2015-04-14 DIAGNOSIS — C792 Secondary malignant neoplasm of skin: Secondary | ICD-10-CM

## 2015-04-14 DIAGNOSIS — C439 Malignant melanoma of skin, unspecified: Secondary | ICD-10-CM

## 2015-04-14 LAB — CBC WITH DIFFERENTIAL/PLATELET
BASO%: 0.5 % (ref 0.0–2.0)
BASOS ABS: 0.1 10*3/uL (ref 0.0–0.1)
EOS%: 0.3 % (ref 0.0–7.0)
Eosinophils Absolute: 0 10*3/uL (ref 0.0–0.5)
HEMATOCRIT: 39.3 % (ref 34.8–46.6)
HEMOGLOBIN: 12.6 g/dL (ref 11.6–15.9)
LYMPH#: 1.7 10*3/uL (ref 0.9–3.3)
LYMPH%: 16 % (ref 14.0–49.7)
MCH: 27.4 pg (ref 25.1–34.0)
MCHC: 32.2 g/dL (ref 31.5–36.0)
MCV: 85.1 fL (ref 79.5–101.0)
MONO#: 0.9 10*3/uL (ref 0.1–0.9)
MONO%: 7.9 % (ref 0.0–14.0)
NEUT%: 75.3 % (ref 38.4–76.8)
NEUTROS ABS: 8.2 10*3/uL — AB (ref 1.5–6.5)
PLATELETS: 292 10*3/uL (ref 145–400)
RBC: 4.61 10*6/uL (ref 3.70–5.45)
RDW: 15.8 % — AB (ref 11.2–14.5)
WBC: 10.9 10*3/uL — ABNORMAL HIGH (ref 3.9–10.3)

## 2015-04-14 LAB — COMPREHENSIVE METABOLIC PANEL
ALBUMIN: 4.1 g/dL (ref 3.5–5.0)
ALK PHOS: 73 U/L (ref 40–150)
ALT: 45 U/L (ref 0–55)
ANION GAP: 11 meq/L (ref 3–11)
AST: 15 U/L (ref 5–34)
BILIRUBIN TOTAL: 0.95 mg/dL (ref 0.20–1.20)
BUN: 15 mg/dL (ref 7.0–26.0)
CALCIUM: 10.8 mg/dL — AB (ref 8.4–10.4)
CO2: 23 mEq/L (ref 22–29)
Chloride: 109 mEq/L (ref 98–109)
Creatinine: 0.9 mg/dL (ref 0.6–1.1)
EGFR: 68 mL/min/{1.73_m2} — AB (ref >90)
GLUCOSE: 92 mg/dL (ref 70–140)
Potassium: 4.2 mEq/L (ref 3.5–5.1)
SODIUM: 143 meq/L (ref 136–145)
TOTAL PROTEIN: 7.4 g/dL (ref 6.4–8.3)

## 2015-04-14 MED ORDER — DEXAMETHASONE 4 MG PO TABS: 4.0000 mg | ORAL_TABLET | Freq: Every day | ORAL | Status: DC

## 2015-04-14 NOTE — Telephone Encounter (Signed)
Gave patient avs report and appointments for March  °

## 2015-04-14 NOTE — Progress Notes (Signed)
Hematology and Oncology Follow Up Visit  Jill Chavez [id number] [birth year] 65 y.o. 04/14/2015 2:55 PM   Principle Diagnosis: 65 year old woman diagnosed with metastatic melanoma after presenting with large frontal lobe brain lesion in April 2016. She has no primary tumor identified and no evidence of other systemic metastasis. 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   Prior Therapy: She is status post craniotomy and resection of a right frontal hemorrhagic mass done on 07/02/2014. The pathology revealed a metastatic melanoma. She is status post SRS to 5 other sites of brain metastasis completed on 07/23/2014, 07/25/2014 and 07/28/2014.   Current therapy:  Zelboraf 960 mg daily twice a day started on 12/24/2014. This was changed to 720 mg twice a day in November 2016 for better tolerance. Dexamethasone 4 mg tablets twice a day starting on 01/27/2015 and the dose was reduced again to 4 mg daily in 02/12/2015. She is currently taking 2 mg daily.   Interim History: Jill Chavez presents today for a follow-up visit. Since the last visit, she reports slight decline in her mentation and speech difficulties. She reports that she had no headaches, blurry vision or syncope. She did report word finding difficulties. She did have MRI done on 04/06/2015 which showed progression of disease with multiple brain metastasis. Vasogenic edema was also detected. Patient was evaluated by Jill Chavez who felt no further radiation can be given at this time.   Despite all of that, she continues to have a reasonable quality of life. She is ambulating without any major difficulties. Has not had any difficulties eating or swallowing. She is taking dexamethasone at 2 mg daily.   She continues to tolerate Zelboraf without any major complications.She has reported  occasional skin rash and overall her subcutaneous nodule in the scalp have improved.  She does not report any fevers, chills,  sweats. She does not report any chest pain, palpitation orthopnea. She does not report any cough or hemoptysis or hematemesis. She does not report any nausea, vomiting, abdominal pain. She does not report any hematochezia or melon a. She does not report any frequency, urgency or hesitancy. She does not report any skeletal complaints. Remaining review of systems unremarkable.  Medications: I have reviewed the patient's current medications.  Current Outpatient Prescriptions  Medication Sig Dispense Refill  . dexamethasone (DECADRON) 4 MG tablet Take 1 tablet (4 mg total) by mouth daily. 60 tablet 1  . fluconazole (DIFLUCAN) 100 MG tablet Take 2 tablets today, then 1 tablet daily x 20 more days. 22 tablet 0  . levothyroxine (SYNTHROID, LEVOTHROID) 125 MCG tablet Take 125 mcg by mouth daily before breakfast.     . ondansetron (ZOFRAN) 4 MG tablet Take 1 tablet (4 mg total) by mouth every 4 (four) hours as needed for nausea or vomiting. 20 tablet 0  . prochlorperazine (COMPAZINE) 10 MG tablet Take 1 tablet (10 mg total) by mouth every 6 (six) hours as needed for nausea or vomiting. 30 tablet 0  . vemurafenib (ZELBORAF) 240 MG tablet Take 3 tablets (720 mg total) by mouth every 12 (twelve) hours. Take with water. 180 tablet 0   No current facility-administered medications for this visit.     Allergies: No Known Allergies  Past Medical History, Surgical history, Social history, and Family History were reviewed and updated.   Physical Exam: Blood pressure 192/107, pulse 80, temperature 98.8 F (37.1 C), temperature source Oral, resp. rate 18, weight 190 lb 8 oz (86.41 kg), SpO2 99 %.  ECOG: 1 General appearance: well-appearing without distress.  Head: Normocephalic, without obvious abnormality no oral thrush noted Neck: no adenopathy. Fat pads noted supraclavicular area bilaterally. Lymph nodes: Cervical, supraclavicular, and axillary nodes normal. Heart:regular rate and rhythm, S1, S2 normal, no  murmur, click, rub or gallop Lung:chest clear, no wheezing, rales, normal symmetric air entry Abdomin: soft, non-tender, without masses or organomegaly or shifting dullness or ascites.  EXT:no erythema, induration, or nodules Skin: Continues decrease of her skin nodules noted on the temporal area as well as forehead. Neurological examination: No deficits noted. Her speech is jumbled with some word finding difficulties.   Lab Results: Lab Results  Component Value Date   WBC 10.9* 04/14/2015   HGB 12.6 04/14/2015   HCT 39.3 04/14/2015   MCV 85.1 04/14/2015   PLT 292 04/14/2015     Chemistry      Component Value Date/Time   NA 142 04/06/2015 1216   NA 141 11/11/2014 1243   K 3.8 04/06/2015 1216   K 3.7 11/11/2014 1243   CL 108 11/11/2014 1243   CO2 24 04/06/2015 1216   CO2 28 11/11/2014 1243   BUN 14.3 04/06/2015 1216   BUN 8 11/11/2014 1243   CREATININE 0.8 04/06/2015 1216   CREATININE 0.62 11/11/2014 1243      Component Value Date/Time   CALCIUM 10.2 04/06/2015 1216   CALCIUM 10.8* 11/11/2014 1243   ALKPHOS 76 04/06/2015 1216   ALKPHOS 63 06/30/2014 0235   AST 27 04/06/2015 1216   AST 19 06/30/2014 0235   ALT 76* 04/06/2015 1216   ALT 12 06/30/2014 0235   BILITOT 1.02 04/06/2015 1216   BILITOT 0.8 06/30/2014 0235      CLINICAL DATA: 65 year old female  with metastatic melanoma. Right frontal lobe tumor resection. 5 sites were treated in the brain with stereotactic radiosurgery in May. Progression suggested in September. Restaging. Subsequent encounter.  EXAM: MRI HEAD WITHOUT AND WITH CONTRAST  TECHNIQUE: Multiplanar, multiecho pulse sequences of the brain and surrounding structures were obtained without and with intravenous contrast.  CONTRAST: 108m MULTIHANCE GADOBENATE DIMEGLUMINE 529 MG/ML IV SOLN  COMPARISON: 01/27/2015, 12/12/2014 and earlier.  FINDINGS: For today's study on series 10, NEW metastases are annotated with SINGLE arrows  (most but not all new metastasis are annotated).  Enlarged PREEXISTING lesions are annotated with DOUBLE arrows.  Greater than 30 new enhancing cerebral metastases scattered throughout both cerebral hemispheres. The deep gray matter nuclei, brainstem, and cerebellum are spared. Most new metastases measure 5-10 mm diameter, but the largest new metastases are up to 22 mm diameter.  New subependymal metastatic disease is confluent along the left temporal horn and also at the body of the left lateral ventricle. Small subependymal metastasis also at the left frontal horn.  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 There is a new right parasagittal dural based metastasis measuring 18 mm diameter just across midline from the meningioma.  Previously-seen right hippocampal tail metastasis has markedly progressed from less than 10 mm previously 8 now to nearly 20 mm diameter.  Most of the new metastases are hemorrhagic. The large anterior right frontal lobe hemorrhagic metastasis has decreased in overall size since September which appears to reflect some interval resolution of hematoma rather than significant improvement in the metastatic tumor. There is widespread new bilateral cerebral edema, although there is only trace leftward midline shift at the anterior septum callosum, and basilar cisterns remain patent. No ventriculomegaly. No intraventricular hemorrhage. No extra-axial hemorrhage. Major intracranial vascular  flow voids are stable.  Stable and negative visualized cervical spine. Visible bone marrow signal remains within normal limits.  Left superior scalp metastasis has regressed since September (series 10, image 120). Left parotid space lymphadenopathy has resolved.  IMPRESSION: 1. Marked progression of cerebral metastatic disease, greater than 30 new brain metastases, most 5-10 mm diameter but up to 22 mm. This includes new  subependymal metastatic disease along the left lateral ventricle. 2. Associated increased areas of cerebral edema, but no significant intracranial mass effect at this time. 3. Hemorrhage component of the large anterior right frontal lobe hemorrhagic metastasis has regressed since September. 4. Left scalp metastasis and parotid space lymphadenopathy has regressed.  Impression and Plan:  65 year old woman with the following issues:  1. Metastatic melanoma presenting with a brain metastasis with her initial CT scan chest abdomen and pelvis obtained on 06/29/2014 did not show any measurable disease. Her tumor was found to be BRAF mutated  PET CT scan on 10/21/2014 showed enlarging subcutaneous nodules.   She is on Zelboraf and have tolerated it very well so far. Her scalp nodules are stable and may be decreasing in the size of the lesion on the left temple. She has tolerated this medication better at 720 mg twice a day.    PET CT scan on 03/12/2015  showed for the most part improving scalp metastasis. She did have a slight progression of her cervical node.  She did have CNS progression of disease and options of therapy was reviewed today. Her systemic disease is under reasonable control better CNS metastasis came difficult to manage. No further radiation therapy is recommended. I do not think any systemic therapy at this time we'll offer much CNS palliation at this point.  I gave her the option of continuing her current systemic therapy versus stopping it. Given that her quality of life continues to be excellent I have recommended continue her systemic therapy and consider hospice if she develops decline in her health or mentation. I do not see any value switching to a different systemic therapy given the likelihood of CNS response is very low.    2. Brain metastasis: She is status post surgical resection followed by [facility]. MRI on 04/06/2015 was discussed and showed progression of disease. No  further therapy from a radiation standpoint is recommended.  I recommended increasing her dexamethasone to 4 mg daily to combat her vasogenic edema and given her speech difficulty. This dose can be increased further if needed to. She understands that her prognosis is rather poor given her CNS metastasis and have limited life expectancy. Her goals of care is palliation and reasonable quality of life.      3. Dermatology surveillance: She is currently following up with dermatology a regular basis.  4. Cardiovascular surveillance: She did have an EKG and had a normal QTc interval. Periodic monitoring will be needed on this current medication.  5. Bilateral papilledema noted on examination by Dr. Valetta Close. No visual changes at this time. She will continue follow-up as needed.  6. Hypercalcemia: Likely related to malignancy and possible bone metastasis. She is status post Zometa in October 2016. Her calcium was within normal range on 03/12/2015  7. Follow-up: Will be in 4 weeks.   Richardo Popoff, MD 1/31/20172:55 PM

## 2015-04-16 ENCOUNTER — Encounter: Payer: Self-pay | Admitting: *Deleted

## 2015-04-16 NOTE — Progress Notes (Signed)
[place] Advance Directives Clinical Social Work  Clinical Social Work was referred by Wadie Lessen, NP, to review and complete healthcare advance directives.  Clinical Social Worker met with patient , patient's daughter, patient's son, and Wadie Lessen in [place] office.  The patient designated daughter [name] as their primary healthcare agent and [name] as their secondary agent.  Patient also completed healthcare living will.    Clinical Social Worker notarized documents and made copies for patient/family. Clinical Social Worker will send documents to medical records to be scanned into patient's chart. Clinical Social Worker encouraged patient/family to contact with any additional questions or concerns.  Polo Riley, MSW, [place] Worker [facility] [phone]

## 2015-04-19 ENCOUNTER — Inpatient Hospital Stay (HOSPITAL_COMMUNITY)
Admission: EM | Admit: 2015-04-19 | Discharge: 2015-04-21 | DRG: 054 | Disposition: A | Discharge status: Hospice | Payer: BLUE CROSS/BLUE SHIELD | Attending: Internal Medicine | Admitting: Internal Medicine

## 2015-04-19 ENCOUNTER — Encounter (HOSPITAL_COMMUNITY): Payer: Self-pay | Admitting: *Deleted

## 2015-04-19 ENCOUNTER — Emergency Department (HOSPITAL_COMMUNITY): Payer: BLUE CROSS/BLUE SHIELD

## 2015-04-19 DIAGNOSIS — G936 Cerebral edema: Secondary | ICD-10-CM | POA: Diagnosis present

## 2015-04-19 DIAGNOSIS — Z79899 Other long term (current) drug therapy: Secondary | ICD-10-CM | POA: Diagnosis not present

## 2015-04-19 DIAGNOSIS — Z7189 Other specified counseling: Secondary | ICD-10-CM | POA: Diagnosis not present

## 2015-04-19 DIAGNOSIS — M1990 Unspecified osteoarthritis, unspecified site: Secondary | ICD-10-CM | POA: Diagnosis present

## 2015-04-19 DIAGNOSIS — C7931 Secondary malignant neoplasm of brain: Secondary | ICD-10-CM | POA: Diagnosis present

## 2015-04-19 DIAGNOSIS — Z66 Do not resuscitate: Secondary | ICD-10-CM | POA: Diagnosis present

## 2015-04-19 DIAGNOSIS — Z515 Encounter for palliative care: Secondary | ICD-10-CM | POA: Diagnosis present

## 2015-04-19 DIAGNOSIS — C799 Secondary malignant neoplasm of unspecified site: Secondary | ICD-10-CM | POA: Diagnosis not present

## 2015-04-19 DIAGNOSIS — K219 Gastro-esophageal reflux disease without esophagitis: Secondary | ICD-10-CM | POA: Diagnosis present

## 2015-04-19 DIAGNOSIS — E039 Hypothyroidism, unspecified: Secondary | ICD-10-CM | POA: Diagnosis present

## 2015-04-19 DIAGNOSIS — Z923 Personal history of irradiation: Secondary | ICD-10-CM

## 2015-04-19 DIAGNOSIS — C439 Malignant melanoma of skin, unspecified: Secondary | ICD-10-CM | POA: Diagnosis present

## 2015-04-19 DIAGNOSIS — R569 Unspecified convulsions: Secondary | ICD-10-CM

## 2015-04-19 LAB — COMPREHENSIVE METABOLIC PANEL
ALT: 32 U/L (ref 14–54)
ANION GAP: 12 (ref 5–15)
AST: 18 U/L (ref 15–41)
Albumin: 3.8 g/dL (ref 3.5–5.0)
Alkaline Phosphatase: 62 U/L (ref 38–126)
BUN: 16 mg/dL (ref 6–20)
CHLORIDE: 104 mmol/L (ref 101–111)
CO2: 23 mmol/L (ref 22–32)
Calcium: 10 mg/dL (ref 8.9–10.3)
Creatinine, Ser: 0.88 mg/dL (ref 0.44–1.00)
Glucose, Bld: 128 mg/dL — ABNORMAL HIGH (ref 65–99)
POTASSIUM: 4.2 mmol/L (ref 3.5–5.1)
Sodium: 139 mmol/L (ref 135–145)
Total Bilirubin: 1.3 mg/dL — ABNORMAL HIGH (ref 0.3–1.2)
Total Protein: 6.6 g/dL (ref 6.5–8.1)

## 2015-04-19 LAB — CBC WITH DIFFERENTIAL/PLATELET
Basophils Absolute: 0 10*3/uL (ref 0.0–0.1)
Basophils Relative: 0 %
EOS PCT: 0 %
Eosinophils Absolute: 0 10*3/uL (ref 0.0–0.7)
HCT: 38.7 % (ref 36.0–46.0)
Hemoglobin: 12.6 g/dL (ref 12.0–15.0)
LYMPHS ABS: 1.1 10*3/uL (ref 0.7–4.0)
LYMPHS PCT: 8 %
MCH: 28.3 pg (ref 26.0–34.0)
MCHC: 32.6 g/dL (ref 30.0–36.0)
MCV: 87 fL (ref 78.0–100.0)
MONO ABS: 0.6 10*3/uL (ref 0.1–1.0)
Monocytes Relative: 4 %
Neutro Abs: 11.7 10*3/uL — ABNORMAL HIGH (ref 1.7–7.7)
Neutrophils Relative %: 88 %
PLATELETS: 248 10*3/uL (ref 150–400)
RBC: 4.45 MIL/uL (ref 3.87–5.11)
RDW: 14.5 % (ref 11.5–15.5)
WBC: 13.5 10*3/uL — ABNORMAL HIGH (ref 4.0–10.5)

## 2015-04-19 MED ORDER — METHYLPREDNISOLONE SODIUM SUCC 125 MG IJ SOLR
125.0000 mg | Freq: Once | INTRAMUSCULAR | Status: AC
  Administered 2015-04-19: 125 mg via INTRAVENOUS
  Filled 2015-04-19: qty 2

## 2015-04-19 MED ORDER — ALUM & MAG HYDROXIDE-SIMETH 200-200-20 MG/5ML PO SUSP: 30.0000 mL | Freq: Four times a day (QID) | ORAL | Status: DC | PRN

## 2015-04-19 MED ORDER — ONDANSETRON HCL 4 MG/2ML IJ SOLN: 4.0000 mg | Freq: Four times a day (QID) | INTRAMUSCULAR | Status: DC | PRN

## 2015-04-19 MED ORDER — SODIUM CHLORIDE 0.9% FLUSH
3.0000 mL | Freq: Two times a day (BID) | INTRAVENOUS | Status: DC
  Administered 2015-04-20: 3 mL via INTRAVENOUS

## 2015-04-19 MED ORDER — LORAZEPAM 1 MG PO TABS: 1.0000 mg | ORAL_TABLET | ORAL | Status: DC | PRN

## 2015-04-19 MED ORDER — LEVETIRACETAM 500 MG/5ML IV SOLN
500.0000 mg | Freq: Two times a day (BID) | INTRAVENOUS | Status: DC
  Administered 2015-04-20 – 2015-04-21 (×4): 500 mg via INTRAVENOUS
  Filled 2015-04-19 (×5): qty 5

## 2015-04-19 MED ORDER — LORAZEPAM 2 MG/ML PO CONC: 1.0000 mg | ORAL | Status: DC | PRN

## 2015-04-19 MED ORDER — ACETAMINOPHEN 650 MG RE SUPP: 650.0000 mg | Freq: Four times a day (QID) | RECTAL | Status: DC | PRN

## 2015-04-19 MED ORDER — MORPHINE SULFATE (PF) 2 MG/ML IV SOLN
1.0000 mg | INTRAVENOUS | Status: DC | PRN
  Administered 2015-04-19: 1 mg via INTRAVENOUS
  Filled 2015-04-19: qty 1

## 2015-04-19 MED ORDER — LEVOTHYROXINE SODIUM 100 MCG IV SOLR
62.5000 ug | Freq: Every day | INTRAVENOUS | Status: DC
  Administered 2015-04-20 – 2015-04-21 (×3): 62.5 ug via INTRAVENOUS
  Filled 2015-04-19 (×3): qty 5

## 2015-04-19 MED ORDER — SODIUM CHLORIDE 0.9 % IV SOLN
1000.0000 mg | Freq: Once | INTRAVENOUS | Status: AC
  Administered 2015-04-19: 1000 mg via INTRAVENOUS
  Filled 2015-04-19 (×2): qty 10

## 2015-04-19 MED ORDER — LORAZEPAM 2 MG/ML IJ SOLN: 1.0000 mg | INTRAMUSCULAR | Status: DC | PRN

## 2015-04-19 MED ORDER — LEVOTHYROXINE SODIUM 100 MCG IV SOLR: 75.0000 ug | Freq: Every day | INTRAVENOUS | Status: DC

## 2015-04-19 MED ORDER — DEXAMETHASONE SODIUM PHOSPHATE 10 MG/ML IJ SOLN
10.0000 mg | Freq: Once | INTRAMUSCULAR | Status: AC
  Administered 2015-04-19: 10 mg via INTRAVENOUS
  Filled 2015-04-19: qty 1

## 2015-04-19 MED ORDER — LORAZEPAM 2 MG/ML IJ SOLN: 2.0000 mg | INTRAMUSCULAR | Status: DC | PRN

## 2015-04-19 MED ORDER — SODIUM CHLORIDE 0.9% FLUSH: 3.0000 mL | INTRAVENOUS | Status: DC | PRN

## 2015-04-19 MED ORDER — DEXTROSE IN LACTATED RINGERS 5 % IV SOLN
INTRAVENOUS | Status: DC
  Administered 2015-04-19: 15:00:00 via INTRAVENOUS

## 2015-04-19 MED ORDER — SODIUM CHLORIDE 0.9 % IV SOLN: 250.0000 mL | INTRAVENOUS | Status: DC | PRN

## 2015-04-19 MED ORDER — DEXAMETHASONE SODIUM PHOSPHATE 4 MG/ML IJ SOLN
6.0000 mg | Freq: Three times a day (TID) | INTRAMUSCULAR | Status: DC
  Administered 2015-04-20 – 2015-04-21 (×4): 6 mg via INTRAVENOUS
  Filled 2015-04-19 (×5): qty 2

## 2015-04-19 MED ORDER — ONDANSETRON HCL 4 MG/2ML IJ SOLN
4.0000 mg | Freq: Four times a day (QID) | INTRAMUSCULAR | Status: DC | PRN
  Administered 2015-04-19: 4 mg via INTRAVENOUS
  Filled 2015-04-19: qty 2

## 2015-04-19 MED ORDER — ONDANSETRON 4 MG PO TBDP: 4.0000 mg | ORAL_TABLET | Freq: Four times a day (QID) | ORAL | Status: DC | PRN

## 2015-04-19 MED ORDER — HYDROMORPHONE HCL 1 MG/ML IJ SOLN: 2.0000 mg | INTRAMUSCULAR | Status: DC | PRN

## 2015-04-19 MED ORDER — ONDANSETRON HCL 4 MG PO TABS: 4.0000 mg | ORAL_TABLET | Freq: Four times a day (QID) | ORAL | Status: DC | PRN

## 2015-04-19 MED ORDER — ACETAMINOPHEN 325 MG PO TABS: 650.0000 mg | ORAL_TABLET | Freq: Four times a day (QID) | ORAL | Status: DC | PRN

## 2015-04-19 NOTE — ED Notes (Signed)
Pt riding in car with family had a witnessed seizure, with shaking, lips turning blue for 1 minute.  Pt post ictal with EMS. Maintaining SATS in 97%.  184/121, CBG 156, no c/o pain, Hx of brain cancer and surgery last year.  No hx seizure.  20 L AC. No Loc .

## 2015-04-19 NOTE — H&P (Addendum)
[facility] History and Physical  Lexi Conaty FEO:[id number] DOB: 18-Nov-1950 DOA: 04/19/2015  Referring physician:  PCP: Haywood Pao, MD   Chief Complaint: Metastatic melanoma/seizures  HPI: Jill Chavez is a 65 y.o. female with a past medical history metastatic melanoma diagnosed in April 2016 when she presented with large frontal lobe lesion, status post craniotomy and resection of right frontal hemorrhagic mass done on 07/02/2014, last MRI of brain performed on 04/06/2015 that showed marked progression of cerebral metastatic disease greater than 30 brain metastasis associated increased areas of cerebral edema brought to the emergency department after family members witnessed a seizure. Family members reporting that they were in their car on the way to lunch where she has sudden onset tonic-clonic activity associated with mental status changes and foaming of the mouth. She was brought to the emergency room found to be post ictal. Family reporting that this is her first seizure. They report that she has also had progressive worsening slurred speech over the past 3 weeks. She has not had falls, focal neurological deficits, nausea, vomiting, fevers, chills or mental status changes. She was last seen by her medical oncologist Dr Alen Blew 04/14/2015 at which time hospice was discussed if she were to develop a decline.    Review of Systems:   Patient confused, having difficulties following commands, cannot rule obtain reliable review of systems   Past Medical History  Diagnosis Date  . Arthritis   . Hypothyroid   . GERD (gastroesophageal reflux disease)   . Brain metastasis ([place])   . S/P radiation therapy 06/02/14-07/14/14    RLL lung 60Gy/64f  . S/P radiation therapy 07/23/14;07/25/14;07/28/14    Brain   Past Surgical History  Procedure Laterality Date  . Craniotomy Right 07/02/2014    Procedure: Stereotactic Craniotomy for right frontal brain tumor excision w/BrainLab;  Surgeon: GKary Kos MD;  [facility];  Service: Neurosurgery;  Laterality: Right;  right    Social History:  reports that she has never smoked. She does not have any smokeless tobacco history on file. She reports that she does not drink alcohol or use illicit drugs.  No Known Allergies  Family History Noncontributory   Prior to Admission medications   Medication Sig Start Date End Date Taking? Authorizing Provider  levothyroxine (SYNTHROID, LEVOTHROID) 125 MCG tablet Take 125 mcg by mouth daily before breakfast.  06/09/14  Yes Historical Provider, MD  vemurafenib (ZELBORAF) 240 MG tablet Take 3 tablets (720 mg total) by mouth every 12 (twelve) hours. Take with water. 03/26/15  Yes FWyatt Portela MD  dexamethasone (DECADRON) 4 MG tablet Take 1 tablet (4 mg total) by mouth daily. 04/14/15   FWyatt Portela MD  fluconazole (DIFLUCAN) 100 MG tablet Take 2 tablets today, then 1 tablet daily x 20 more days. 04/08/15   SEppie Gibson MD  ondansetron (ZOFRAN) 4 MG tablet Take 1 tablet (4 mg total) by mouth every 4 (four) hours as needed for nausea or vomiting. 07/05/14   SJonetta Osgood MD  prochlorperazine (COMPAZINE) 10 MG tablet Take 1 tablet (10 mg total) by mouth every 6 (six) hours as needed for nausea or vomiting. 12/24/14   FWyatt Portela MD   Physical Exam: Filed Vitals:   04/19/15 1500 04/19/15 1530 04/19/15 1630  BP: 173/115 172/111 168/116  Pulse: 91 89 89  Temp: 98.9 F (37.2 C)    TempSrc: Oral    Resp: _0 SpO2: 93% 93% 95%    Wt Readings  from Last 3 Encounters:  04/14/15 86.41 kg (190 lb 8 oz)  04/08/15 86.32 kg (190 lb 4.8 oz)  03/18/15 85.866 kg (189 lb 4.8 oz)    General:  Appears calm and comfortable Eyes: PERRL, normal lids, irises & conjunctiva ENT: grossly normal hearing, lips & tongue Neck: no LAD, masses or thyromegaly Cardiovascular: RRR, no m/r/g. No LE edema. Telemetry: SR, no arrhythmias  Respiratory: CTA bilaterally, no w/r/r. Normal respiratory  effort. Abdomen: soft, ntnd Skin: no rash or induration seen on limited exam Musculoskeletal: grossly normal tone BUE/BLE Psychiatric: grossly normal mood and affect, speech fluent and appropriate Neurologic: grossly non-focal.          Labs on Admission:  Basic Metabolic Panel:  Recent Labs Lab 04/14/15 1423  NA 143  K 4.2  CO2 23  GLUCOSE 92  BUN 15.0  CREATININE 0.9  CALCIUM 10.8*   Liver Function Tests:  Recent Labs Lab 04/14/15 1423  AST 15  ALT 45  ALKPHOS 73  BILITOT 0.95  PROT 7.4  ALBUMIN 4.1   No results for input(s): LIPASE, AMYLASE in the last 168 hours. No results for input(s): AMMONIA in the last 168 hours. CBC:  Recent Labs Lab 04/14/15 1423 04/19/15 1614  WBC 10.9* 13.5*  NEUTROABS 8.2* 11.7*  HGB 12.6 12.6  HCT 39.3 38.7  MCV 85.1 87.0  PLT 292 248   Cardiac Enzymes: No results for input(s): CKTOTAL, CKMB, CKMBINDEX, TROPONINI in the last 168 hours.  BNP (last 3 results) No results for input(s): BNP in the last 8760 hours.  ProBNP (last 3 results) No results for input(s): PROBNP in the last 8760 hours.  CBG: No results for input(s): GLUCAP in the last 168 hours.  Radiological Exams on Admission: Ct Head Wo Contrast  04/19/2015  CLINICAL DATA:  Seizure. History of metastatic disease to brain. Melanoma EXAM: CT HEAD WITHOUT CONTRAST TECHNIQUE: Contiguous axial images were obtained from the base of the skull through the vertex without intravenous contrast. COMPARISON:  MRI head 04/06/2015.  CT head 11/11/2014 FINDINGS: Numerous hyperdense mass lesions are seen throughout both cerebral hemispheres compatible with hemorrhagic metastatic disease. These were present on the recent MRI. Progression of hemorrhage and edema of the left frontal lesion which currently measures 2.7 cm compared with 2.2 cm previously. Low-density edema and mass in the right frontal lobe appears approximately the same compared with the recent MRI. Ventricle size is  normal.  No shift of the midline structures. No acute skull abnormality. Left parasagittal calcific lesion over the convexity unchanged from the prior CT. This appears extra-axial and could represent meningioma. IMPRESSION: Extensive metastatic disease with numerous hemorrhagic lesions throughout both cerebral hemispheres. Overall there is progression of disease. There is a 2.7 cm hemorrhagic lesion left frontal lobe with edema which has progressed. Electronically Signed   By: Franchot Gallo M.D.   On: 04/19/2015 16:15    Assessment/Plan Principal Problem:   Metastatic melanoma ([place]) Active Problems:   Brain metastasis ([place])   Metastatic cancer to brain ([place])   Seizure ([place])   1. Metastatic melanoma. Patient is an unfortunate 65 year old female with a past medical history of metastatic melanoma in April 2016 when she initially presented with balance problems. At the time initial workup included a CT scan of brain that showed evidence of metastatic disease and a 34 mm mass in the right frontal lobe associate with with acute hemorrhage. On 07/02/2014 she underwent resection of right frontal hemorrhagic mass, procedure performed by Dr. Saintclair Halsted. In May  2016 she underwent stereotactic brain radiosurgery to 5 other sites of metastatic disease. Meanwhile pathology revealed metastatic melanoma. She was being followed by Dr.Shadad undergoing treatment with Zelboraf and decadron. Her most recent MRI of brain was performed on 04/06/2015 that showed marked progression of cerebral metastatic disease to greater than 30 units brain metastasis.On her last visit with medical oncology on 04/12/2015 the option of hospice was discussed if she were to have a decline. 2. Seizures. Likely secondary to metastatic involvement of brain and associated edema. She was given 1000 mg of IV Keppra in the emergency department, will continue IVF 500 mg twice a day for seizure prophylaxis.           Goals of care. I had an extensive  discussion with family members in the emergency department regarding her medical goals        of care. I explained most recent MRI that showed markedly progression of the cerebral metastatic disease despite receiving         systemic therapy. They met with palliative care last week. Family would like to transition her care to comfort measures and focus on quality of life. They would not want for her to undergo further systemic therapy. Neither do they wish for cardiopulmonary resuscitation or heroic measures to be taken. She is a DNR. I discussed the philosophy of palliative care and options regarding receiving hospice services. They are interested in transitoning to Inpatient Hospice. Will consult Palliative Care and Social Work.   Code Status: DNR DVT Prophylaxis: SCD's, has history of hemorrhagic brain metastasis Family Communication: I spoke with her husband, son, daughter at bedside  Disposition Plan: Will admit to the inpatient service, anticipate may need greater than 2 nights hospitalization   Time spent: 70 min  Kelvin Chavez [facility] Pager [phone]

## 2015-04-19 NOTE — Care Management Note (Addendum)
Case Management Note  Patient Details  Name: [name] MRN: [id number] Date of Birth: [birth year]  Subjective/Objective:   CM received call from  J Mesner,MD re: placement of this 65 y.o. pt  With Metastatic Melanoma from the ED to a Hospice facility. She was brought to the ED after having a witnessed seizure in the family auto en-route from [place].   She was diagnosed 06/2014. S/p Craniotomy and resection 06/2014. Completed 5 sites of Radiation Tx 5/11, 5/13 and 5/16. Currently taking po Chemotherapy Q12 H.                Action/Plan: MD plans to consult  Coralyn Pear, MD for possible admission due to new  Seizures. CM spoke with Family at the bedside, husband of 1 yrs Trilby Drummer Dominica Severin) [phone] Loletha Grayer [phone]; Daughter [name] [facility]) [phone] and [name] C: [phone] who are all acutely aware of her worsening condition and poor prognosis. All agree [facility] as the provider of services although they seem unsure of whether they desire a facility or at home service. Called referral to [phone] and relayed information to  Week-end RN AMY who will pass info to Intake and have someone contact family Monday 04/20/2015.     Expected Discharge Date:                  Expected Discharge Plan:  [place]  In-House Referral:  Clinical Social Work  Discharge planning Services  CM Consult  Post Acute Care Choice:    Choice offered to:     DME Arranged:    DME Agency:     HH Arranged:    [place] Agency:     Status of Service:  In process, will continue to follow  Medicare Important Message Given:    Date Medicare IM Given:    Medicare IM give by:    Date Additional Medicare IM Given:    Additional Medicare Important Message give by:     If discussed at [place] of Stay Meetings, dates discussed:    Additional Comments:  Delrae Sawyers, RN 04/19/2015, 4:26 PM

## 2015-04-19 NOTE — ED Notes (Addendum)
Attempted report 

## 2015-04-19 NOTE — ED Notes (Signed)
Off unit with CT 

## 2015-04-19 NOTE — ED Provider Notes (Signed)
CSN: [id number]     Arrival date & time 04/19/15  1401 History   First MD Initiated Contact with Patient 04/19/15 1403     Chief Complaint  Patient presents with  . Seizures     (Consider location/radiation/quality/duration/timing/severity/associated sxs/prior Treatment) Patient is a 65 y.o. female presenting with seizures.  Seizures Seizure activity on arrival: no   Seizure type:  Grand mal Preceding symptoms: no sensation of an aura present   Initial focality:  Diffuse Episode characteristics: apnea, generalized shaking and unresponsiveness   Episode characteristics: no abnormal movements   Return to baseline: no   Severity:  Moderate Duration:  1 minute Timing:  Once Number of seizures this episode:  1 Progression:  Improving Context: intracranial lesion     Past Medical History  Diagnosis Date  . Arthritis   . Hypothyroid   . GERD (gastroesophageal reflux disease)   . Brain metastasis ([place])   . S/P radiation therapy 06/02/14-07/14/14    RLL lung 60Gy/60fx  . S/P radiation therapy 07/23/14;07/25/14;07/28/14    Brain   Past Surgical History  Procedure Laterality Date  . Craniotomy Right 07/02/2014    Procedure: Stereotactic Craniotomy for right frontal brain tumor excision w/BrainLab;  Surgeon: Kary Kos, MD;  Location: [facility];  Service: Neurosurgery;  Laterality: Right;  right    History reviewed. No pertinent family history. Social History  Substance Use Topics  . Smoking status: Never Smoker   . Smokeless tobacco: None  . Alcohol Use: No   OB History    No data available     Review of Systems  Unable to perform ROS: Mental status change  Neurological: Positive for seizures.      Allergies  Review of patient's allergies indicates no known allergies.  Home Medications   Prior to Admission medications   Medication Sig Start Date End Date Taking? Authorizing Provider  levothyroxine (SYNTHROID, LEVOTHROID) 125 MCG tablet Take 125 mcg by mouth daily  before breakfast.  06/09/14  Yes Historical Provider, MD  vemurafenib (ZELBORAF) 240 MG tablet Take 3 tablets (720 mg total) by mouth every 12 (twelve) hours. Take with water. 03/26/15  Yes Wyatt Portela, MD  dexamethasone (DECADRON) 4 MG tablet Take 1 tablet (4 mg total) by mouth daily. 04/14/15   Wyatt Portela, MD  fluconazole (DIFLUCAN) 100 MG tablet Take 2 tablets today, then 1 tablet daily x 20 more days. 04/08/15   Eppie Gibson, MD  ondansetron (ZOFRAN) 4 MG tablet Take 1 tablet (4 mg total) by mouth every 4 (four) hours as needed for nausea or vomiting. 07/05/14   Jonetta Osgood, MD  prochlorperazine (COMPAZINE) 10 MG tablet Take 1 tablet (10 mg total) by mouth every 6 (six) hours as needed for nausea or vomiting. 12/24/14   Wyatt Portela, MD   BP 165/113 mmHg  Pulse 92  Temp(Src) 98.9 F (37.2 C) (Oral)  Resp 28  SpO2 94% Physical Exam  Constitutional: She appears well-developed and well-nourished.  HENT:  Head: Normocephalic and atraumatic.  Neck: Normal range of motion.  Cardiovascular: Normal rate and regular rhythm.   Pulmonary/Chest: No stridor. No respiratory distress.  Abdominal: Soft. She exhibits no distension.  Neurological: She is alert. A cranial nerve deficit (facial droop) is present. Coordination normal.  Skin: Skin is warm and dry.  Nursing note and vitals reviewed.   ED Course  Procedures (including critical care time)  CRITICAL CARE Performed by: Merrily Pew  Total critical care time: 30 minutes Critical care  time was exclusive of separately billable procedures and treating other patients. Critical care was necessary to treat or prevent imminent or life-threatening deterioration. Critical care was time spent personally by me on the following activities: development of treatment plan with patient and/or surrogate as well as nursing, discussions with consultants, evaluation of patient's response to treatment, examination of patient, obtaining history from  patient or surrogate, ordering and performing treatments and interventions, ordering and review of laboratory studies, ordering and review of radiographic studies, pulse oximetry and re-evaluation of patient's condition.   Labs Review Labs Reviewed  CBC WITH DIFFERENTIAL/PLATELET - Abnormal; Notable for the following:    WBC 13.5 (*)    Neutro Abs 11.7 (*)    All other components within normal limits  COMPREHENSIVE METABOLIC PANEL - Abnormal; Notable for the following:    Glucose, Bld 128 (*)    Total Bilirubin 1.3 (*)    All other components within normal limits    Imaging Review Ct Head Wo Contrast  04/19/2015  CLINICAL DATA:  Seizure. History of metastatic disease to brain. Melanoma EXAM: CT HEAD WITHOUT CONTRAST TECHNIQUE: Contiguous axial images were obtained from the base of the skull through the vertex without intravenous contrast. COMPARISON:  MRI head 04/06/2015.  CT head 11/11/2014 FINDINGS: Numerous hyperdense mass lesions are seen throughout both cerebral hemispheres compatible with hemorrhagic metastatic disease. These were present on the recent MRI. Progression of hemorrhage and edema of the left frontal lesion which currently measures 2.7 cm compared with 2.2 cm previously. Low-density edema and mass in the right frontal lobe appears approximately the same compared with the recent MRI. Ventricle size is normal.  No shift of the midline structures. No acute skull abnormality. Left parasagittal calcific lesion over the convexity unchanged from the prior CT. This appears extra-axial and could represent meningioma. IMPRESSION: Extensive metastatic disease with numerous hemorrhagic lesions throughout both cerebral hemispheres. Overall there is progression of disease. There is a 2.7 cm hemorrhagic lesion left frontal lobe with edema which has progressed. Electronically Signed   By: Franchot Gallo M.D.   On: 04/19/2015 16:15   I have personally reviewed and evaluated these images and lab  results as part of my medical decision-making.   EKG Interpretation   Date/Time:  Sunday April 19 2015 14:48:53 EST Ventricular Rate:  90 PR Interval:  126 QRS Duration: 100 QT Interval:  402 QTC Calculation: 491 R Axis:   -5 Text Interpretation:  Normal sinus rhythm Moderate voltage criteria for  LVH, may be normal variant Prolonged QT Abnormal ECG Confirmed by Aimi Essner  MD, Corene Cornea [phone]) on 04/19/2015 2:55:25 PM      MDM   Final diagnoses:  Seizure ([place])   65 yo F with metastatic cancer to the brain here after seizure. Also with HTN and hypoxia, tachypnea concdrning for worsening metastatic disease and cerebral edema. CT done confirming this. Given steroids and keppra. D/w neurology who thought there was unlikely to be anything to be done by hospital stay.  D/W family and patient DNR/DNI and had been thinkng about palliative care and hospice options as of recently. I discussed with them that there would likely be no interntentional options or therapeutic options at this time aside from comfort care types of things and waiting to see how things progressed. Family agreed, SW consulted to attempt to set up hospice and felt it best to institute from an observation stay in the hospital so patient was admitted ot medicine to transition to hospice.   Critical care  in reference to initial assessment and stabilization along with end of life, goals of care discussions with family, internal medicine and social work.     Merrily Pew, MD 04/19/15 [phone]

## 2015-04-20 DIAGNOSIS — Z515 Encounter for palliative care: Secondary | ICD-10-CM

## 2015-04-20 DIAGNOSIS — C799 Secondary malignant neoplasm of unspecified site: Secondary | ICD-10-CM

## 2015-04-20 MED ORDER — LORAZEPAM 2 MG/ML PO CONC: 1.0000 mg | Freq: Four times a day (QID) | ORAL | Status: AC | PRN

## 2015-04-20 MED ORDER — MORPHINE SULFATE (CONCENTRATE) 10 MG/0.5ML PO SOLN: 10.0000 mg | ORAL | Status: AC | PRN

## 2015-04-20 NOTE — Consult Note (Signed)
Consultation Note Date: 04/20/2015   Patient Name: Jill Chavez  DOB: 07-30-1950  MRN: [id number]  Age / Sex: 65 y.o., female  PCP: Haywood Pao, MD Referring Physician: Thurnell Lose, MD  Oncologist:  Dr. Alen Blew  Reason for Consultation: Hospice Evaluation and Inpatient hospice referral    Clinical Assessment/Narrative: 65 yo female diagnosed with melanoma as a right frontal hemorrhagic brain mass in April of 2016. She presented to the ER on 2/5 with seizure.  Previously, she underwent resection of the mass in 4/16 and subsequently stereotactic radiosurgery to 5 other sites of metastatic disease in May 2016.  In January 2017 a brain MRI showed marked progression of metastatic disease with over 30 brain metastasis.  Over the past month she has had balance difficulties and worsening of her speech.  On 2/5 the patient was riding in the car with her family when she experienced a tonic clonic seizure and was brought to the emergency room.  Due to progression of brain hemorrhage and edema of the left frontal lesion hospice care was recommended by the primary team.  Brain edema and subsequently seizures developed despite the daily decadron that oncology has had her on since 01/27/2015.  Rad Onc is recommendation no further treatment.  Medical Onc recommended hospice on 1/31 if the patient declined.  Her goals are comfort care.  I met with [name], her daughter, Sheppard Plumber), son ([name]) and husband at bedside.  The patient and family's preference is to be discharged to residential hospice of [place].  The family is very coherent in their planning.  Daughter [name] [facility]) presented me with advanced directives that indicate the patient does not want resuscitation.   Contacts/Participants in Discussion: Patient, Son and Daughter. Primary Decision Maker: Family.  Lead by the patient.  HCPOA:  Yes,  [name]   SUMMARY OF RECOMMENDATIONS  Code Status/Advance Care Planning: DNR    Code Status Orders        Start     Ordered   04/19/15 2003  Do not attempt resuscitation (DNR)   Continuous    Question Answer Comment  In the event of cardiac or respiratory ARREST Do not call a "code blue"   In the event of cardiac or respiratory ARREST Do not perform Intubation, CPR, defibrillation or ACLS   In the event of cardiac or respiratory ARREST Use medication by any route, position, wound care, and other measures to relive pain and suffering. May use oxygen, suction and manual treatment of airway obstruction as needed for comfort.      04/19/15 2002    Code Status History    Date Active Date Inactive Code Status Order ID Comments User Context   04/19/2015  4:45 PM 04/19/2015  8:02 PM DNR [id number]  Merrily Pew, MD ED   07/02/2014  8:50 PM 07/05/2014  3:48 PM Full Code [id number]  Kary Kos, MD Inpatient   06/29/2014  2:43 AM 07/02/2014  8:50 PM Full Code [id number]  Phillips Grout, MD Inpatient      Other Directives:None  Symptom Management:   Per Primary team.  Would continue decadron and keppra  Psycho-social/Spiritual:  Support System: Strong   Prognosis: < 2 weeks.  Given advanced brain metastasis and edema patient is at high risk for an acute event.  Discharge Planning: Hospice facility vs Home with hospice services.   Chief Complaint/ Primary Diagnoses: Present on Admission:  . Brain metastasis ([place]) . Metastatic melanoma ([place]) . Metastatic cancer to brain [facility])  I  have reviewed the medical record, interviewed the patient and family, and examined the patient. The following aspects are pertinent.  Past Medical History  Diagnosis Date  . Arthritis   . Hypothyroid   . GERD (gastroesophageal reflux disease)   . Brain metastasis ([place])   . S/P radiation therapy 06/02/14-07/14/14    RLL lung 60Gy/62f  . S/P radiation therapy 07/23/14;07/25/14;07/28/14    Brain   Social History    Social History  . Marital Status: Married    Spouse Name: N/A  . Number of Children: N/A  . Years of Education: N/A   Social History Main Topics  . Smoking status: Never Smoker   . Smokeless tobacco: None  . Alcohol Use: No  . Drug Use: No  . Sexual Activity: Not Asked   Other Topics Concern  . None   Social History Narrative   History reviewed. No pertinent family history. Scheduled Meds: . dexamethasone  6 mg Intravenous 3 times per day  . levETIRAcetam  500 mg Intravenous BID  . levothyroxine  62.5 mcg Intravenous Daily  . sodium chloride flush  3 mL Intravenous Q12H   Continuous Infusions:  PRN Meds:.sodium chloride, acetaminophen **OR** acetaminophen, alum & mag hydroxide-simeth, HYDROmorphone (DILAUDID) injection, LORazepam, ondansetron **OR** ondansetron (ZOFRAN) IV, sodium chloride flush Medications Prior to Admission:  Prior to Admission medications   Medication Sig Start Date End Date Taking? Authorizing Provider  levothyroxine (SYNTHROID, LEVOTHROID) 125 MCG tablet Take 125 mcg by mouth daily before breakfast.  06/09/14  Yes Historical Provider, MD  vemurafenib (ZELBORAF) 240 MG tablet Take 3 tablets (720 mg total) by mouth every 12 (twelve) hours. Take with water. 03/26/15  Yes FWyatt Portela MD  dexamethasone (DECADRON) 4 MG tablet Take 1 tablet (4 mg total) by mouth daily. 04/14/15   FWyatt Portela MD  fluconazole (DIFLUCAN) 100 MG tablet Take 2 tablets today, then 1 tablet daily x 20 more days. 04/08/15   SEppie Gibson MD  LORazepam (ATIVAN) 2 MG/ML concentrated solution Take 0.5 mLs (1 mg total) by mouth every 6 (six) hours as needed for anxiety. 04/20/15   PThurnell Lose MD  Morphine Sulfate (MORPHINE CONCENTRATE) 10 MG/0.5ML SOLN concentrated solution Take 0.5 mLs (10 mg total) by mouth every 3 (three) hours as needed for moderate pain or severe pain. 04/20/15   PThurnell Lose MD  ondansetron (ZOFRAN) 4 MG tablet Take 1 tablet (4 mg total) by mouth every 4  (four) hours as needed for nausea or vomiting. 07/05/14   SJonetta Osgood MD  prochlorperazine (COMPAZINE) 10 MG tablet Take 1 tablet (10 mg total) by mouth every 6 (six) hours as needed for nausea or vomiting. 12/24/14   FWyatt Portela MD   No Known Allergies  Review of Systems  Constitutional: Positive for activity change, appetite change and fatigue.  Eyes: Negative.   Respiratory: Negative.   Cardiovascular: Positive for leg swelling.  Gastrointestinal: Negative.   Endocrine: Negative.   Genitourinary: Negative.   Musculoskeletal: Negative.   Skin: Negative.   Allergic/Immunologic: Negative.   Neurological: Positive for dizziness, weakness and headaches.  Hematological: Negative.   Psychiatric/Behavioral: Negative.     Physical Exam  Wd, Wn female, Alert and orientated.  NAD  Sitting up in recliner, family at bedside CV:  S2, S1 Resp:  No increased work of breathing Extremities:  Able to move all 4.  ++ lower extremity edema  Vital Signs: BP 141/96 mmHg  Pulse 88  Temp(Src) 98.2 F (36.8 C) (  Oral)  Resp 19  Ht 5' 5"  (1.651 m)  Wt 84.4 kg (186 lb 1.1 oz)  BMI 30.96 kg/m2  SpO2 93%  SpO2: SpO2: 93 % O2 Device:SpO2: 93 % O2 Flow Rate: .   IO: Intake/output summary:   Intake/Output Summary (Last 24 hours) at 04/20/15 1335 Last data filed at 04/20/15 1100  Gross per 24 hour  Intake    240 ml  Output    500 ml  Net   -260 ml    LBM:   Baseline Weight: Weight: 84.4 kg (186 lb 1.1 oz) Most recent weight: Weight: 84.4 kg (186 lb 1.1 oz)      Palliative Assessment/Data:  Flowsheet Rows        Most Recent Value   Intake Tab    Referral Department  Hospitalist   Unit at Time of Referral  Cardiac/Telemetry Unit   Palliative Care Primary Diagnosis  Cancer   Date Notified  04/19/15   Palliative Care Type  New Palliative care   Reason for referral  Counsel Regarding Hospice, End of [place]   Date of Admission  04/19/15   Date first seen by  Palliative Care  04/20/15   # of days Palliative referral response time  1 Day(s)   # of days IP prior to Palliative referral  0   Clinical Assessment    Palliative Performance Scale Score  50%   Psychosocial & Spiritual Assessment    Palliative Care Outcomes    Patient/Family meeting held?  Yes   Palliative Care Outcomes  Counseled regarding hospice, Clarified goals of care   Patient/Family wishes: Interventions discontinued/not started   Mechanical Ventilation      Additional Data Reviewed:  CBC:    Component Value Date/Time   WBC 13.5* 04/19/2015 1614   WBC 10.9* 04/14/2015 1423   HGB 12.6 04/19/2015 1614   HGB 12.6 04/14/2015 1423   HCT 38.7 04/19/2015 1614   HCT 39.3 04/14/2015 1423   PLT 248 04/19/2015 1614   PLT 292 04/14/2015 1423   MCV 87.0 04/19/2015 1614   MCV 85.1 04/14/2015 1423   NEUTROABS 11.7* 04/19/2015 1614   NEUTROABS 8.2* 04/14/2015 1423   LYMPHSABS 1.1 04/19/2015 1614   LYMPHSABS 1.7 04/14/2015 1423   MONOABS 0.6 04/19/2015 1614   MONOABS 0.9 04/14/2015 1423   EOSABS 0.0 04/19/2015 1614   EOSABS 0.0 04/14/2015 1423   BASOSABS 0.0 04/19/2015 1614   BASOSABS 0.1 04/14/2015 1423   Comprehensive Metabolic Panel:    Component Value Date/Time   NA 139 04/19/2015 1614   NA 143 04/14/2015 1423   K 4.2 04/19/2015 1614   K 4.2 04/14/2015 1423   CL 104 04/19/2015 1614   CO2 23 04/19/2015 1614   CO2 23 04/14/2015 1423   BUN 16 04/19/2015 1614   BUN 15.0 04/14/2015 1423   CREATININE 0.88 04/19/2015 1614   CREATININE 0.9 04/14/2015 1423   GLUCOSE 128* 04/19/2015 1614   GLUCOSE 92 04/14/2015 1423   CALCIUM 10.0 04/19/2015 1614   CALCIUM 10.8* 04/14/2015 1423   AST 18 04/19/2015 1614   AST 15 04/14/2015 1423   ALT 32 04/19/2015 1614   ALT 45 04/14/2015 1423   ALKPHOS 62 04/19/2015 1614   ALKPHOS 73 04/14/2015 1423   BILITOT 1.3* 04/19/2015 1614   BILITOT 0.95 04/14/2015 1423   PROT 6.6 04/19/2015 1614   PROT 7.4 04/14/2015 1423   ALBUMIN 3.8  04/19/2015 1614   ALBUMIN 4.1 04/14/2015 1423  Time In: 12:50 Time Out: 1:35 Time Total: 45 min Greater than 50%  of this time was spent counseling and coordinating care related to the above assessment and plan.  Signed by:  Imogene Burn, PA-C Palliative Medicine Pager: [phone]    04/20/2015, 1:35 PM  Please contact Palliative Medicine Team phone at [phone] for questions and concerns.

## 2015-04-20 NOTE — Progress Notes (Signed)
CSW will follow up with [facility] on tomorrow for patient to be evaluated. CSW will continue to follow and provide support to patient while in hospital.   Lucius Conn, [place] Worker [facility] Ph: [phone]

## 2015-04-20 NOTE — Progress Notes (Signed)
Patient Demographics:    Jill Chavez, is a 65 y.o. female, DOB - 1951/02/09, [id number]  Admit date - 04/19/2015   Admitting Physician Kelvin Cellar, MD  Outpatient Primary MD for the patient is Haywood Pao, MD  LOS - 1   Chief Complaint  Patient presents with  . Seizures      Summary  Patient is an unfortunate 65 year old female with a past medical history of metastatic melanoma in April 2016 when she initially presented with balance problems. At the time initial workup included a CT scan of brain that showed evidence of metastatic disease and a 34 mm mass in the right frontal lobe associate with with acute hemorrhage. On 07/02/2014 she underwent resection of right frontal hemorrhagic mass, procedure performed by Dr. Saintclair Halsted. In May 2016 she underwent stereotactic brain radiosurgery to 5 other sites of metastatic disease. Meanwhile pathology revealed metastatic melanoma. She was being followed by Dr.Shadad undergoing treatment with Zelboraf and decadron. Her most recent MRI of brain was performed on 04/06/2015 that showed marked progression of cerebral metastatic disease to greater than 30 brain metastasis. Patient and family now wart residential hospice.    Subjective:    Jill Chavez today has, No headache, No chest pain, No abdominal pain - No Nausea, No new weakness tingling or numbness, No Cough - SOB.     Assessment  & Plan :      1. 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 MRI confirms multiple brain lesions, patient was following with Dr. Alen Blew in the outpatient setting, they were already looking at palliative care and now want residential hospice transition. Palate of care has been consulted along with social work.   2. Seizures due to above. She was loaded with Keppra and  kept on Keppra with no further seizures. Continue Decadron as well for now. Will continue Keppra upon discharge. Family does not want any heroic measures.      Code Status : DNR  Family Communication  : Husband bedside  Disposition Plan  : Residential hospice  Consults  :  Pall Care  Procedures  :    DVT Prophylaxis  :  SCDs   Lab Results  Component Value Date   PLT 248 04/19/2015    Inpatient Medications  Scheduled Meds: . dexamethasone  6 mg Intravenous 3 times per day  . levETIRAcetam  500 mg Intravenous BID  . levothyroxine  62.5 mcg Intravenous Daily  . sodium chloride flush  3 mL Intravenous Q12H   Continuous Infusions:  PRN Meds:.sodium chloride, acetaminophen **OR** acetaminophen, alum & mag hydroxide-simeth, HYDROmorphone (DILAUDID) injection, LORazepam, ondansetron **OR** ondansetron (ZOFRAN) IV, sodium chloride flush  Antibiotics  :     Anti-infectives    None        Objective:   Filed Vitals:   04/19/15 1900 04/19/15 1930 04/19/15 2001 04/20/15 0445  BP: 165/108 139/97 135/94 141/96  Pulse: 87 86 86 88  Temp:   98.4 F (36.9 C) 98.2 F (36.8 C)  TempSrc:   Oral   Resp: 22 22 20 19   Height:   5\' 5"  (1.651 m)   Weight:   84.4 kg (186 lb 1.1 oz)   SpO2: 96% 94% 94% 93%    Wt Readings from  Last 3 Encounters:  04/19/15 84.4 kg (186 lb 1.1 oz)  04/14/15 86.41 kg (190 lb 8 oz)  04/08/15 86.32 kg (190 lb 4.8 oz)     Intake/Output Summary (Last 24 hours) at 04/20/15 1119 Last data filed at 04/20/15 1100  Gross per 24 hour  Intake    240 ml  Output    500 ml  Net   -260 ml     Physical Exam  Awake Alert, Oriented X 3, No new F.N deficits, mild expressive aphasia, Normal affect [place].AT,PERRAL Supple Neck,No JVD, No cervical lymphadenopathy appriciated.  Symmetrical Chest wall movement, Good air movement bilaterally, CTAB RRR,No Gallops,Rubs or new Murmurs, No Parasternal Heave +ve B.Sounds, Abd Soft, No tenderness, No organomegaly  appriciated, No rebound - guarding or rigidity. No Cyanosis, Clubbing or edema, No new Rash or bruise       Data Review:   Micro Results No results found for this or any previous visit (from the past 240 hour(s)).  Radiology Reports Ct Head Wo Contrast  04/19/2015  CLINICAL DATA:  Seizure. History of metastatic disease to brain. Melanoma EXAM: CT HEAD WITHOUT CONTRAST TECHNIQUE: Contiguous axial images were obtained from the base of the skull through the vertex without intravenous contrast. COMPARISON:  MRI head 04/06/2015.  CT head 11/11/2014 FINDINGS: Numerous hyperdense mass lesions are seen throughout both cerebral hemispheres compatible with hemorrhagic metastatic disease. These were present on the recent MRI. Progression of hemorrhage and edema of the left frontal lesion which currently measures 2.7 cm compared with 2.2 cm previously. Low-density edema and mass in the right frontal lobe appears approximately the same compared with the recent MRI. Ventricle size is normal.  No shift of the midline structures. No acute skull abnormality. Left parasagittal calcific lesion over the convexity unchanged from the prior CT. This appears extra-axial and could represent meningioma. IMPRESSION: Extensive metastatic disease with numerous hemorrhagic lesions throughout both cerebral hemispheres. Overall there is progression of disease. There is a 2.7 cm hemorrhagic lesion left frontal lobe with edema which has progressed. Electronically Signed   By: Franchot Gallo M.D.   On: 04/19/2015 16:15   Mr [name] [id number] Contrast  04/06/2015  ADDENDUM REPORT: 04/06/2015 16:17 ADDENDUM: Study discussed by telephone with Dr. Judson Roch SQUIRE on 04/06/2015 at 1609 hours. Electronically Signed   By: Genevie Ann M.D.   On: 04/06/2015 16:17  04/06/2015  CLINICAL DATA:  65 year old female with metastatic melanoma. Right frontal lobe tumor resection. 5 sites were treated in the brain with stereotactic radiosurgery in May. Progression  suggested in September. Restaging. Subsequent encounter. EXAM: MRI HEAD WITHOUT AND WITH CONTRAST TECHNIQUE: Multiplanar, multiecho pulse sequences of the brain and surrounding structures were obtained without and with intravenous contrast. CONTRAST:  55mL MULTIHANCE GADOBENATE DIMEGLUMINE 529 MG/ML IV SOLN COMPARISON:  01/27/2015, 12/12/2014 and earlier. FINDINGS: For today's study on series 10, NEW metastases are annotated with SINGLE arrows (most but not all new metastasis are annotated). Enlarged PREEXISTING lesions are annotated with DOUBLE arrows. Greater than 30 new enhancing cerebral metastases scattered throughout both cerebral hemispheres. The deep gray matter nuclei, brainstem, and cerebellum are spared. Most new metastases measure 5-10 mm diameter, but the largest new metastases are up to 22 mm diameter. New subependymal metastatic disease is confluent along the left temporal horn and also at the body of the left lateral ventricle. Small subependymal metastasis also at the left frontal horn. 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 There is a new right parasagittal dural  based metastasis measuring 18 mm diameter just across midline from the meningioma. Previously-seen right hippocampal tail metastasis has markedly progressed from less than 10 mm previously 8 now to nearly 20 mm diameter. Most of the new metastases are hemorrhagic. The large anterior right frontal lobe hemorrhagic metastasis has decreased in overall size since September which appears to reflect some interval resolution of hematoma rather than significant improvement in the metastatic tumor. There is widespread new bilateral cerebral edema, although there is only trace leftward midline shift at the anterior septum callosum, and basilar cisterns remain patent. No ventriculomegaly. No intraventricular hemorrhage. No extra-axial hemorrhage. Major intracranial vascular flow voids are stable. Stable and  negative visualized cervical spine. Visible bone marrow signal remains within normal limits. Left superior scalp metastasis has regressed since September (series 10, image 120). Left parotid space lymphadenopathy has resolved. IMPRESSION: 1. Marked progression of cerebral metastatic disease, greater than 30 new brain metastases, most 5-10 mm diameter but up to 22 mm. This includes new subependymal metastatic disease along the left lateral ventricle. 2. Associated increased areas of cerebral edema, but no significant intracranial mass effect at this time. 3. Hemorrhage component of the large anterior right frontal lobe hemorrhagic metastasis has regressed since September. 4. Left scalp metastasis and parotid space lymphadenopathy has regressed. Electronically Signed: By: Genevie Ann M.D. On: 04/06/2015 16:02     CBC  Recent Labs Lab 04/14/15 1423 04/19/15 1614  WBC 10.9* 13.5*  HGB 12.6 12.6  HCT 39.3 38.7  PLT 292 248  MCV 85.1 87.0  MCH 27.4 28.3  MCHC 32.2 32.6  RDW 15.8* 14.5  LYMPHSABS 1.7 1.1  MONOABS 0.9 0.6  EOSABS 0.0 0.0  BASOSABS 0.1 0.0    Chemistries   Recent Labs Lab 04/14/15 1423 04/19/15 1614  NA 143 139  K 4.2 4.2  CL  --  104  CO2 23 23  GLUCOSE 92 128*  BUN 15.0 16  CREATININE 0.9 0.88  CALCIUM 10.8* 10.0  AST 15 18  ALT 45 32  ALKPHOS 73 62  BILITOT 0.95 1.3*   ------------------------------------------------------------------------------------------------------------------ No results for input(s): CHOL, HDL, LDLCALC, TRIG, CHOLHDL, LDLDIRECT in the last 72 hours.  No results found for: HGBA1C ------------------------------------------------------------------------------------------------------------------ No results for input(s): TSH, T4TOTAL, T3FREE, THYROIDAB in the last 72 hours.  Invalid input(s): FREET3 ------------------------------------------------------------------------------------------------------------------ No results for input(s):  VITAMINB12, FOLATE, FERRITIN, TIBC, IRON, RETICCTPCT in the last 72 hours.  Coagulation profile No results for input(s): INR, PROTIME in the last 168 hours.  No results for input(s): DDIMER in the last 72 hours.  Cardiac Enzymes No results for input(s): CKMB, TROPONINI, MYOGLOBIN in the last 168 hours.  Invalid input(s): CK ------------------------------------------------------------------------------------------------------------------ No results found for: BNP  Time Spent in minutes  30   SINGH,PRASHANT K M.D on 04/20/2015 at 11:19 AM  Between 7am to 7pm - Pager - [phone]  After 7pm go to [email or web address] - password [facility]  [facility] -  Office  [phone]

## 2015-04-21 ENCOUNTER — Ambulatory Visit: Payer: BLUE CROSS/BLUE SHIELD | Admitting: Oncology

## 2015-04-21 ENCOUNTER — Other Ambulatory Visit: Payer: BLUE CROSS/BLUE SHIELD

## 2015-04-21 DIAGNOSIS — Z7189 Other specified counseling: Secondary | ICD-10-CM | POA: Insufficient documentation

## 2015-04-21 MED ORDER — LEVETIRACETAM 500 MG PO TABS: 500.0000 mg | ORAL_TABLET | Freq: Two times a day (BID) | ORAL | Status: AC

## 2015-04-21 MED ORDER — DEXAMETHASONE 4 MG PO TABS: 4.0000 mg | ORAL_TABLET | Freq: Two times a day (BID) | ORAL | Status: AC

## 2015-04-21 MED ORDER — DEXAMETHASONE SODIUM PHOSPHATE 4 MG/ML IJ SOLN: 4.0000 mg | Freq: Two times a day (BID) | INTRAMUSCULAR | Status: DC

## 2015-04-21 NOTE — Progress Notes (Signed)
Daily Progress Note   Patient Name: Jill Chavez       Date: 04/21/2015 DOB: May 24, 1950  Age: 65 y.o. MRN#: [id number] Attending Physician: Thurnell Lose, MD Primary Care Physician: Haywood Pao, MD Admit Date: 04/19/2015  Reason for Consultation/Follow-up: Establishing goals of care and Hospice Evaluation  Subjective: Patient feels well.  Eating breakfast.  Per husband needs minimal assistance to ambulate to bathroom.   Wants to go home with [facility] services.  We completed the MOST form.  Patient and husband elect comfort measures with no return to the hospital.    Length of Stay: 2 days  Current Medications: Scheduled Meds:  . dexamethasone  4 mg Intravenous Q12H  . levETIRAcetam  500 mg Intravenous BID  . levothyroxine  62.5 mcg Intravenous Daily  . sodium chloride flush  3 mL Intravenous Q12H    Continuous Infusions:    PRN Meds: sodium chloride, acetaminophen **OR** acetaminophen, alum & mag hydroxide-simeth, HYDROmorphone (DILAUDID) injection, LORazepam, ondansetron **OR** ondansetron (ZOFRAN) IV, sodium chloride flush   Physical Exam            Awake, Alert, in NAD eating breakfast.     Vital Signs: BP 142/76 mmHg  Pulse 58  Temp(Src) 98 F (36.7 C) (Oral)  Resp 20  Ht 5\' 5"  (1.651 m)  Wt 84.4 kg (186 lb 1.1 oz)  BMI 30.96 kg/m2  SpO2 96% SpO2: SpO2: 96 % O2 Device: O2 Device: Not Delivered O2 Flow Rate:    Intake/output summary:  Intake/Output Summary (Last 24 hours) at 04/21/15 0901 Last data filed at 04/21/15 [id number]  Gross per 24 hour  Intake    480 ml  Output   1200 ml  Net   -720 ml   LBM:   Baseline Weight: Weight: 84.4 kg (186 lb 1.1 oz) Most recent weight: Weight: 84.4 kg (186 lb 1.1 oz)       Palliative  Assessment/Data: Flowsheet Rows        Most Recent Value   Intake Tab    Referral Department  Hospitalist   Unit at Time of Referral  Cardiac/Telemetry Unit   Palliative Care Primary Diagnosis  Cancer   Date Notified  04/19/15   Palliative Care Type  New Palliative care   Reason for referral  Counsel Regarding Hospice, End  of Life Care Assistance   Date of Admission  04/19/15   Date first seen by Palliative Care  04/20/15   # of days Palliative referral response time  1 Day(s)   # of days IP prior to Palliative referral  0   Clinical Assessment    Palliative Performance Scale Score  50%   Psychosocial & Spiritual Assessment    Palliative Care Outcomes    Patient/Family meeting held?  Yes   Palliative Care Outcomes  Counseled regarding hospice, Clarified goals of care   Patient/Family wishes: Interventions discontinued/not started   Mechanical Ventilation      Additional Data Reviewed: CBC    Component Value Date/Time   WBC 13.5* 04/19/2015 1614   WBC 10.9* 04/14/2015 1423   RBC 4.45 04/19/2015 1614   RBC 4.61 04/14/2015 1423   HGB 12.6 04/19/2015 1614   HGB 12.6 04/14/2015 1423   HCT 38.7 04/19/2015 1614   HCT 39.3 04/14/2015 1423   PLT 248 04/19/2015 1614   PLT 292 04/14/2015 1423   MCV 87.0 04/19/2015 1614   MCV 85.1 04/14/2015 1423   MCH 28.3 04/19/2015 1614   MCH 27.4 04/14/2015 1423   MCHC 32.6 04/19/2015 1614   MCHC 32.2 04/14/2015 1423   RDW 14.5 04/19/2015 1614   RDW 15.8* 04/14/2015 1423   LYMPHSABS 1.1 04/19/2015 1614   LYMPHSABS 1.7 04/14/2015 1423   MONOABS 0.6 04/19/2015 1614   MONOABS 0.9 04/14/2015 1423   EOSABS 0.0 04/19/2015 1614   EOSABS 0.0 04/14/2015 1423   BASOSABS 0.0 04/19/2015 1614   BASOSABS 0.1 04/14/2015 1423    CMP     Component Value Date/Time   NA 139 04/19/2015 1614   NA 143 04/14/2015 1423   K 4.2 04/19/2015 1614   K 4.2 04/14/2015 1423   CL 104 04/19/2015 1614   CO2 23 04/19/2015 1614   CO2 23 04/14/2015 1423   GLUCOSE  128* 04/19/2015 1614   GLUCOSE 92 04/14/2015 1423   BUN 16 04/19/2015 1614   BUN 15.0 04/14/2015 1423   CREATININE 0.88 04/19/2015 1614   CREATININE 0.9 04/14/2015 1423   CALCIUM 10.0 04/19/2015 1614   CALCIUM 10.8* 04/14/2015 1423   PROT 6.6 04/19/2015 1614   PROT 7.4 04/14/2015 1423   ALBUMIN 3.8 04/19/2015 1614   ALBUMIN 4.1 04/14/2015 1423   AST 18 04/19/2015 1614   AST 15 04/14/2015 1423   ALT 32 04/19/2015 1614   ALT 45 04/14/2015 1423   ALKPHOS 62 04/19/2015 1614   ALKPHOS 73 04/14/2015 1423   BILITOT 1.3* 04/19/2015 1614   BILITOT 0.95 04/14/2015 1423   GFRNONAA >60 04/19/2015 1614   GFRAA >60 04/19/2015 1614       Problem List:  Patient Active Problem List   Diagnosis Date Noted  . Encounter for palliative care   . Metastatic melanoma ([place]) 04/19/2015  . Metastatic cancer to brain ([place]) 04/19/2015  . Seizure ([place]) 04/19/2015  . Melanoma of skin ([place]) 01/27/2015  . Hypercalcemia 12/30/2014  . Brain tumor ([place]) 07/02/2014  . Personality change 06/28/2014  . Gait abnormality 06/28/2014  . Frontal mass of brain 06/28/2014  . Hemorrhage, intracranial ([place]) 06/28/2014  . Cerebral edema ([place]) 06/28/2014  . Intracranial hematoma ([place]) 06/28/2014  . Balance problem   . Brain metastasis [facility])      Palliative Care Assessment & Plan    1.Code Status:  DNR    Code Status Orders        Start  Ordered   04/19/15 2003  Do not attempt resuscitation (DNR)   Continuous    Question Answer Comment  In the event of cardiac or respiratory ARREST Do not call a "code blue"   In the event of cardiac or respiratory ARREST Do not perform Intubation, CPR, defibrillation or ACLS   In the event of cardiac or respiratory ARREST Use medication by any route, position, wound care, and other measures to relive pain and suffering. May use oxygen, suction and manual treatment of airway obstruction as needed for comfort.      04/19/15 2002    Code Status History    Date  Active Date Inactive Code Status Order ID Comments User Context   04/19/2015  4:45 PM 04/19/2015  8:02 PM DNR [id number]  Merrily Pew, MD ED   07/02/2014  8:50 PM 07/05/2014  3:48 PM Full Code [id number]  Kary Kos, MD Inpatient   06/29/2014  2:43 AM 07/02/2014  8:50 PM Full Code [id number]  Phillips Grout, MD Inpatient       2. Goals of Care/Additional Recommendations:  Home with Hospice services.  Comfort Measures.  No return to the hospital anticipated.      Care plan was discussed with Patient, Husband and TRH MD.  Thank you for allowing the Palliative Medicine Team to assist in the care of this patient.   Time In: 8:50 Time Out: 9:05 Total Time 15 min Prolonged Time Billed No}         Imogene Burn, [place] [facility] Pager: [phone]  04/21/2015, 9:01 AM  Please contact Palliative Medicine Team phone at [phone] for questions and concerns.

## 2015-04-21 NOTE — Discharge Summary (Signed)
Jill Chavez, is a 65 y.o. female  DOB 1950/05/13  MRN [id number].  Admission date:  04/19/2015  Admitting Physician  Kelvin Cellar, MD  Discharge Date:  04/21/2015   Primary MD  Haywood Pao, MD  Recommendations for primary care physician for things to follow:   Goal of care is comfort/hospice.   Admission Diagnosis  Seizure ([place]) [R56.9]   Discharge Diagnosis  Seizure ([place]) [R56.9]    Principal Problem:   Metastatic melanoma ([place]) Active Problems:   Brain metastasis ([place])   Metastatic cancer to brain [facility])   Seizure ([place])   Encounter for palliative care   Advanced directives, counseling/discussion      Past Medical History  Diagnosis Date  . Arthritis   . Hypothyroid   . GERD (gastroesophageal reflux disease)   . Brain metastasis ([place])   . S/P radiation therapy 06/02/14-07/14/14    RLL lung 60Gy/34fx  . S/P radiation therapy 07/23/14;07/25/14;07/28/14    Brain    Past Surgical History  Procedure Laterality Date  . Craniotomy Right 07/02/2014    Procedure: Stereotactic Craniotomy for right frontal brain tumor excision w/BrainLab;  Surgeon: Kary Kos, MD;  Location: [facility];  Service: Neurosurgery;  Laterality: Right;  right        HPI  from the history and physical done on the day of admission:   Jill Chavez is a 65 y.o. female with a past medical history metastatic melanoma diagnosed in April 2016 when she presented with large frontal lobe lesion, status post craniotomy and resection of right frontal hemorrhagic mass done on 07/02/2014, last MRI of brain performed on 04/06/2015 that showed marked progression of cerebral metastatic disease greater than 30 brain metastasis associated increased areas of cerebral edema brought to the emergency department after family members witnessed a  seizure. Family members reporting that they were in their car on the way to lunch where she has sudden onset tonic-clonic activity associated with mental status changes and foaming of the mouth. She was brought to the emergency room found to be post ictal. Family reporting that this is her first seizure. They report that she has also had progressive worsening slurred speech over the past 3 weeks. She has not had falls, focal neurological deficits, nausea, vomiting, fevers, chills or mental status changes. She was last seen by her medical oncologist Dr Alen Blew 04/14/2015 at which time hospice was discussed if she were to develop a decline.       Hospital Course:     1. 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 MRI confirms multiple brain lesions, patient was following with Dr. Alen Blew in the outpatient setting, they were already looking at palliative care and now want hospice transition. Palate of care has been consulted along with social work. Only now wants to go home with home hospice which will be arranged. We be discharged on oral Decadron along with Keppra. Follow-up with oncology to evaluate the need for ongoing chemotherapy which family wants to stop.  2. Seizures due to above. She  was loaded with Keppra and kept on Keppra with no further seizures. Continue Decadron as well for now. Will continue Keppra upon discharge. Family does not want any heroic measures.       Discharge Condition: Guarded  Follow UP  Follow-up Information    Follow up with [facility].   Contact information:   [address] [place] [address] [phone]       Follow up with Haywood Pao, MD. Schedule an appointment as soon as possible for a visit in 1 week.   Specialty:  Internal Medicine   Contact information:   [address] [place] [address] [phone]       Follow up with [facility], MD. Schedule an appointment as soon as possible for a visit in 1 week.    Specialty:  Oncology   Contact information:   [place] [address] [phone]        Consults obtained - Pall.Care  Diet and Activity recommendation: See Discharge Instructions below  Discharge Instructions       Discharge Instructions    Diet - low sodium heart healthy    Complete by:  As directed      Discharge instructions    Complete by:  As directed   Follow with Primary MD Haywood Pao, MD in 7 days   Get CBC, CMP, 2 view Chest X ray checked  by Primary MD next visit.    Activity: As tolerated with Full fall precautions use walker/cane & assistance as needed   Disposition Home     Diet:   Heart Healthy  with feeding assistance and aspiration precautions.  For Heart failure patients - Check your Weight same time everyday, if you gain over 2 pounds, or you develop in leg swelling, experience more shortness of breath or chest pain, call your Primary MD immediately. Follow Cardiac Low Salt Diet and 1.5 lit/day fluid restriction.   On your next visit with your primary care physician please Get Medicines reviewed and adjusted.   Please request your Prim.MD to go over all Hospital Tests and Procedure/Radiological results at the follow up, please get all Hospital records sent to your Prim MD by signing hospital release before you go home.   If you experience worsening of your admission symptoms, develop shortness of breath, life threatening emergency, suicidal or homicidal thoughts you must seek medical attention immediately by calling 911 or calling your MD immediately  if symptoms less severe.  You Must read complete instructions/literature along with all the possible adverse reactions/side effects for all the Medicines you take and that have been prescribed to you. Take any new Medicines after you have completely understood and accpet all the possible adverse reactions/side effects.   Do not drive, operating heavy machinery, perform activities at  heights, swimming or participation in water activities or provide baby sitting services if your were admitted for syncope or siezures until you have seen by Primary MD or a Neurologist and advised to do so again.  Do not drive when taking Pain medications.    Do not take more than prescribed Pain, Sleep and Anxiety Medications  Special Instructions: If you have smoked or chewed Tobacco  in the last 2 yrs please stop smoking, stop any regular Alcohol  and or any Recreational drug use.  Wear Seat belts while driving.   Please note  You were cared for by a hospitalist during your hospital stay. If you have any questions about your  discharge medications or the care you received while you were in the hospital after you are discharged, you can call the unit and asked to speak with the hospitalist on call if the hospitalist that took care of you is not available. Once you are discharged, your primary care physician will handle any further medical issues. Please note that NO REFILLS for any discharge medications will be authorized once you are discharged, as it is imperative that you return to your primary care physician (or establish a relationship with a primary care physician if you do not have one) for your aftercare needs so that they can reassess your need for medications and monitor your lab values.     Increase activity slowly    Complete by:  As directed              Discharge Medications       Medication List    TAKE these medications        dexamethasone 4 MG tablet  Commonly known as:  DECADRON  Take 1 tablet (4 mg total) by mouth 2 (two) times daily.     fluconazole 100 MG tablet  Commonly known as:  DIFLUCAN  Take 2 tablets today, then 1 tablet daily x 20 more days.     levETIRAcetam 500 MG tablet  Commonly known as:  KEPPRA  Take 1 tablet (500 mg total) by mouth 2 (two) times daily.     levothyroxine 125 MCG tablet  Commonly known as:  SYNTHROID, LEVOTHROID  Take  125 mcg by mouth daily before breakfast.     LORazepam 2 MG/ML concentrated solution  Commonly known as:  ATIVAN  Take 0.5 mLs (1 mg total) by mouth every 6 (six) hours as needed for anxiety.     morphine CONCENTRATE 10 MG/0.5ML Soln concentrated solution  Take 0.5 mLs (10 mg total) by mouth every 3 (three) hours as needed for moderate pain or severe pain.     ondansetron 4 MG tablet  Commonly known as:  ZOFRAN  Take 1 tablet (4 mg total) by mouth every 4 (four) hours as needed for nausea or vomiting.     prochlorperazine 10 MG tablet  Commonly known as:  COMPAZINE  Take 1 tablet (10 mg total) by mouth every 6 (six) hours as needed for nausea or vomiting.     vemurafenib 240 MG tablet  Commonly known as:  ZELBORAF  Take 3 tablets (720 mg total) by mouth every 12 (twelve) hours. Take with water.        Major procedures and Radiology Reports - PLEASE review detailed and final reports for all details, in brief -      Ct Head Wo Contrast  04/19/2015  CLINICAL DATA:  Seizure. History of metastatic disease to brain. Melanoma EXAM: CT HEAD WITHOUT CONTRAST TECHNIQUE: Contiguous axial images were obtained from the base of the skull through the vertex without intravenous contrast. COMPARISON:  MRI head 04/06/2015.  CT head 11/11/2014 FINDINGS: Numerous hyperdense mass lesions are seen throughout both cerebral hemispheres compatible with hemorrhagic metastatic disease. These were present on the recent MRI. Progression of hemorrhage and edema of the left frontal lesion which currently measures 2.7 cm compared with 2.2 cm previously. Low-density edema and mass in the right frontal lobe appears approximately the same compared with the recent MRI. Ventricle size is normal.  No shift of the midline structures. No acute skull abnormality. Left parasagittal calcific lesion over the convexity unchanged from the prior CT. This appears  extra-axial and could represent meningioma. IMPRESSION: Extensive  metastatic disease with numerous hemorrhagic lesions throughout both cerebral hemispheres. Overall there is progression of disease. There is a 2.7 cm hemorrhagic lesion left frontal lobe with edema which has progressed. Electronically Signed   By: Franchot Gallo M.D.   On: 04/19/2015 16:15   Mr [name] [id number] Contrast  04/06/2015  ADDENDUM REPORT: 04/06/2015 16:17 ADDENDUM: Study discussed by telephone with Dr. Judson Roch SQUIRE on 04/06/2015 at 1609 hours. Electronically Signed   By: Genevie Ann M.D.   On: 04/06/2015 16:17  04/06/2015  CLINICAL DATA:  65 year old female with metastatic melanoma. Right frontal lobe tumor resection. 5 sites were treated in the brain with stereotactic radiosurgery in May. Progression suggested in September. Restaging. Subsequent encounter. EXAM: MRI HEAD WITHOUT AND WITH CONTRAST TECHNIQUE: Multiplanar, multiecho pulse sequences of the brain and surrounding structures were obtained without and with intravenous contrast. CONTRAST:  62mL MULTIHANCE GADOBENATE DIMEGLUMINE 529 MG/ML IV SOLN COMPARISON:  01/27/2015, 12/12/2014 and earlier. FINDINGS: For today's study on series 10, NEW metastases are annotated with SINGLE arrows (most but not all new metastasis are annotated). Enlarged PREEXISTING lesions are annotated with DOUBLE arrows. Greater than 30 new enhancing cerebral metastases scattered throughout both cerebral hemispheres. The deep gray matter nuclei, brainstem, and cerebellum are spared. Most new metastases measure 5-10 mm diameter, but the largest new metastases are up to 22 mm diameter. New subependymal metastatic disease is confluent along the left temporal horn and also at the body of the left lateral ventricle. Small subependymal metastasis also at the left frontal horn. 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 There is a new right parasagittal dural based metastasis measuring 18 mm diameter just across midline from the meningioma. Previously-seen  right hippocampal tail metastasis has markedly progressed from less than 10 mm previously 8 now to nearly 20 mm diameter. Most of the new metastases are hemorrhagic. The large anterior right frontal lobe hemorrhagic metastasis has decreased in overall size since September which appears to reflect some interval resolution of hematoma rather than significant improvement in the metastatic tumor. There is widespread new bilateral cerebral edema, although there is only trace leftward midline shift at the anterior septum callosum, and basilar cisterns remain patent. No ventriculomegaly. No intraventricular hemorrhage. No extra-axial hemorrhage. Major intracranial vascular flow voids are stable. Stable and negative visualized cervical spine. Visible bone marrow signal remains within normal limits. Left superior scalp metastasis has regressed since September (series 10, image 120). Left parotid space lymphadenopathy has resolved. IMPRESSION: 1. Marked progression of cerebral metastatic disease, greater than 30 new brain metastases, most 5-10 mm diameter but up to 22 mm. This includes new subependymal metastatic disease along the left lateral ventricle. 2. Associated increased areas of cerebral edema, but no significant intracranial mass effect at this time. 3. Hemorrhage component of the large anterior right frontal lobe hemorrhagic metastasis has regressed since September. 4. Left scalp metastasis and parotid space lymphadenopathy has regressed. Electronically Signed: By: Genevie Ann M.D. On: 04/06/2015 16:02    Micro Results      No results found for this or any previous visit (from the past 240 hour(s)).     Today   Subjective    Mayari Biese today has no headache,no chest abdominal pain,no new weakness tingling or numbness, feels much better wants to go home today.    Objective   Blood pressure 142/76, pulse 58, temperature 98 F (36.7 C), temperature source Oral, resp. rate 20, height 5'  5" (1.651 m),  weight 84.4 kg (186 lb 1.1 oz), SpO2 96 %.   Intake/Output Summary (Last 24 hours) at 04/21/15 1115 Last data filed at 04/21/15 0814  Gross per 24 hour  Intake    240 ml  Output   1000 ml  Net   -760 ml    Exam Awake Alert, Oriented x 3, No new F.N deficits, mild expressive aphasia Normal affect [place].AT,PERRAL Supple Neck,No JVD, No cervical lymphadenopathy appriciated.  Symmetrical Chest wall movement, Good air movement bilaterally, CTAB RRR,No Gallops,Rubs or new Murmurs, No Parasternal Heave +ve B.Sounds, Abd Soft, Non tender, No organomegaly appriciated, No rebound -guarding or rigidity. No Cyanosis, Clubbing or edema, No new Rash or bruise   Data Review   CBC w Diff: Lab Results  Component Value Date   WBC 13.5* 04/19/2015   WBC 10.9* 04/14/2015   HGB 12.6 04/19/2015   HGB 12.6 04/14/2015   HCT 38.7 04/19/2015   HCT 39.3 04/14/2015   PLT 248 04/19/2015   PLT 292 04/14/2015   LYMPHOPCT 8 04/19/2015   LYMPHOPCT 16.0 04/14/2015   MONOPCT 4 04/19/2015   MONOPCT 7.9 04/14/2015   EOSPCT 0 04/19/2015   EOSPCT 0.3 04/14/2015   BASOPCT 0 04/19/2015   BASOPCT 0.5 04/14/2015    CMP: Lab Results  Component Value Date   NA 139 04/19/2015   NA 143 04/14/2015   K 4.2 04/19/2015   K 4.2 04/14/2015   CL 104 04/19/2015   CO2 23 04/19/2015   CO2 23 04/14/2015   BUN 16 04/19/2015   BUN 15.0 04/14/2015   CREATININE 0.88 04/19/2015   CREATININE 0.9 04/14/2015   PROT 6.6 04/19/2015   PROT 7.4 04/14/2015   ALBUMIN 3.8 04/19/2015   ALBUMIN 4.1 04/14/2015   BILITOT 1.3* 04/19/2015   BILITOT 0.95 04/14/2015   ALKPHOS 62 04/19/2015   ALKPHOS 73 04/14/2015   AST 18 04/19/2015   AST 15 04/14/2015   ALT 32 04/19/2015   ALT 45 04/14/2015  .   Total Time in preparing paper work, data evaluation and todays exam - 35 minutes  Thurnell Lose M.D on 04/21/2015 at 11:15 AM  [facility]   Office  [phone]

## 2015-04-21 NOTE — Discharge Instructions (Signed)
Follow with Primary MD Haywood Pao, MD in 7 days   Get CBC, CMP, 2 view Chest X ray checked  by Primary MD next visit.    Activity: As tolerated with Full fall precautions use walker/cane & assistance as needed   Disposition Home     Diet:   Heart Healthy  with feeding assistance and aspiration precautions.  For Heart failure patients - Check your Weight same time everyday, if you gain over 2 pounds, or you develop in leg swelling, experience more shortness of breath or chest pain, call your Primary MD immediately. Follow Cardiac Low Salt Diet and 1.5 lit/day fluid restriction.   On your next visit with your primary care physician please Get Medicines reviewed and adjusted.   Please request your Prim.MD to go over all Hospital Tests and Procedure/Radiological results at the follow up, please get all Hospital records sent to your Prim MD by signing hospital release before you go home.   If you experience worsening of your admission symptoms, develop shortness of breath, life threatening emergency, suicidal or homicidal thoughts you must seek medical attention immediately by calling 911 or calling your MD immediately  if symptoms less severe.  You Must read complete instructions/literature along with all the possible adverse reactions/side effects for all the Medicines you take and that have been prescribed to you. Take any new Medicines after you have completely understood and accpet all the possible adverse reactions/side effects.   Do not drive, operating heavy machinery, perform activities at heights, swimming or participation in water activities or provide baby sitting services if your were admitted for syncope or siezures until you have seen by Primary MD or a Neurologist and advised to do so again.  Do not drive when taking Pain medications.    Do not take more than prescribed Pain, Sleep and Anxiety Medications  Special Instructions: If you have smoked or chewed Tobacco   in the last 2 yrs please stop smoking, stop any regular Alcohol  and or any Recreational drug use.  Wear Seat belts while driving.   Please note  You were cared for by a hospitalist during your hospital stay. If you have any questions about your discharge medications or the care you received while you were in the hospital after you are discharged, you can call the unit and asked to speak with the hospitalist on call if the hospitalist that took care of you is not available. Once you are discharged, your primary care physician will handle any further medical issues. Please note that NO REFILLS for any discharge medications will be authorized once you are discharged, as it is imperative that you return to your primary care physician (or establish a relationship with a primary care physician if you do not have one) for your aftercare needs so that they can reassess your need for medications and monitor your lab values.

## 2015-04-21 NOTE — Discharge Planning (Signed)
Patient discharged home in stable condition. Verbalizes understanding of all discharge instructions, including home medications and follow up appointments. 

## 2015-04-21 NOTE — Care Management Note (Signed)
Case Management Note  Patient Details  Name: Jill Chavez MRN: [id number] Date of Birth: 12-Apr-1950  Subjective/Objective:                    Action/Plan: Spoke with patient and her husband at bedside. Confirmed PCP is Dr Osborne Casco. Patient [facility] to discharge [facility] phone [phone] fax [phone] . Patient has bedside commode and walker at home already. Patient and husband do not want a hospital bed at this time . Patient not requiring oxygen at this time. Patient will go home in family car.   Spoke with  Beth at [facility] , information faxed and aware discharge is today . Beth asked she be called when patient leaves hospital .  Expected Discharge Date:                  Expected Discharge Plan:  [place] (Home with [facility])  In-House Referral:     Discharge planning Services  CM Consult  Post Acute Care Choice:    Choice offered to:  Adult Children, Patient, Spouse [name] [phone]; [name] [phone])  DME Arranged:    DME Agency:     HH Arranged:    HH Agency:  [facility]  Status of Service:  Completed, signed off  Medicare Important Message Given:    Date Medicare IM Given:    Medicare IM give by:    Date Additional Medicare IM Given:    Additional Medicare Important Message give by:     If discussed at [place] of Stay Meetings, dates discussed:    Additional Comments:  Marilu Favre, RN 04/21/2015, 9:59 AM

## 2015-04-23 ENCOUNTER — Ambulatory Visit (HOSPITAL_COMMUNITY): Admission: RE | Admit: 2015-04-23 | Payer: Self-pay | Source: Ambulatory Visit

## 2015-04-24 ENCOUNTER — Ambulatory Visit (HOSPITAL_COMMUNITY): Payer: Self-pay

## 2015-04-27 DIAGNOSIS — Z7189 Other specified counseling: Secondary | ICD-10-CM | POA: Insufficient documentation

## 2015-04-27 DIAGNOSIS — C7931 Secondary malignant neoplasm of brain: Secondary | ICD-10-CM | POA: Insufficient documentation

## 2015-04-27 DIAGNOSIS — Z515 Encounter for palliative care: Secondary | ICD-10-CM | POA: Insufficient documentation

## 2015-04-29 ENCOUNTER — Ambulatory Visit: Payer: Self-pay | Admitting: Radiation Oncology

## 2015-04-29 ENCOUNTER — Encounter: Payer: Self-pay | Admitting: *Deleted

## 2015-04-29 ENCOUNTER — Telehealth: Payer: Self-pay | Admitting: *Deleted

## 2015-05-13 NOTE — Telephone Encounter (Signed)
[employer] [employer] called to say that patient expired today.

## 2015-05-13 DEATH — deceased

## 2015-05-19 ENCOUNTER — Ambulatory Visit: Payer: BLUE CROSS/BLUE SHIELD | Admitting: Oncology

## 2015-05-19 ENCOUNTER — Other Ambulatory Visit: Payer: BLUE CROSS/BLUE SHIELD

## 2016-02-18 ENCOUNTER — Encounter: Payer: Self-pay | Admitting: Radiation Therapy

## 2016-02-26 ENCOUNTER — Other Ambulatory Visit: Payer: Self-pay | Admitting: Nurse Practitioner

## 2016-04-21 IMAGING — CT CT HEAD W/O CM
1 series · 15 of 30 positions shown, 19 images · non-contrast
Comparison: None.

CLINICAL DATA: Patient not acting the same. Stuttering since
[REDACTED].

EXAM:
CT HEAD WITHOUT CONTRAST
TECHNIQUE: Contiguous axial images were obtained from the base of the skull
through the vertex without intravenous contrast.

[Series 2: headtrauma 4.8 h37s · axial · 0.43mm/px · z∈[+65,+194]mm · 15 of 30 slices shown, 19 images]
[im 2/30  brain]
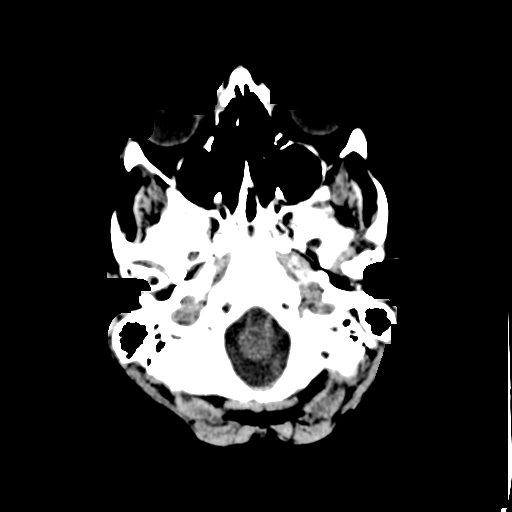
[im 2/30  bone]
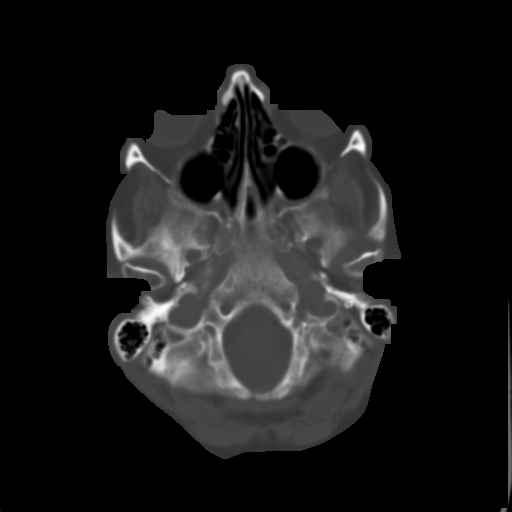
[im 4/30  brain]
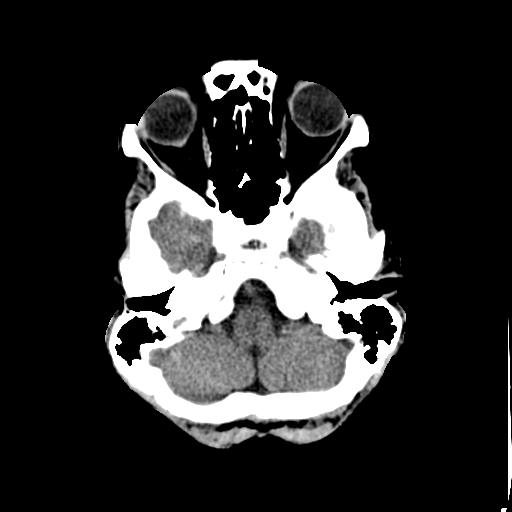
[im 6/30  brain]
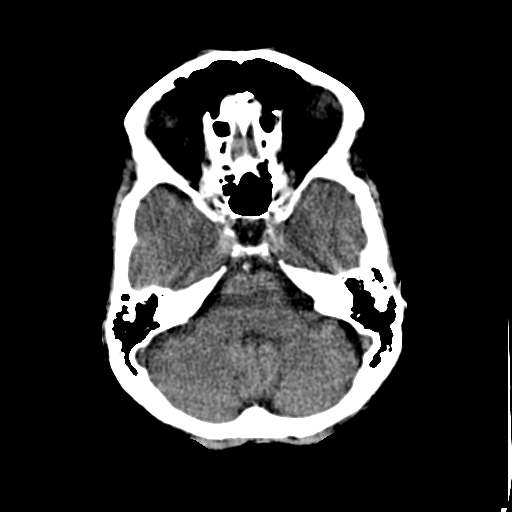
[im 8/30  brain]
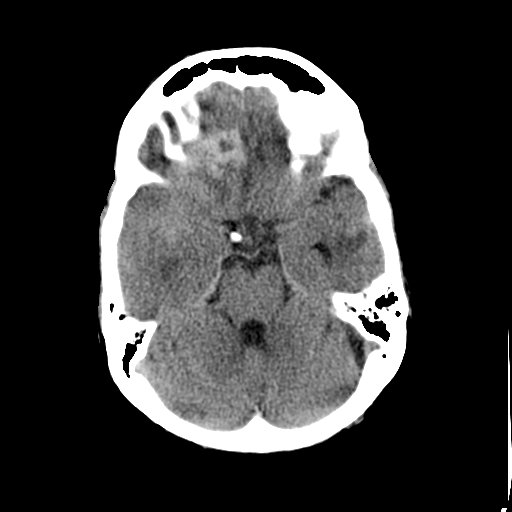
[im 10/30  brain]
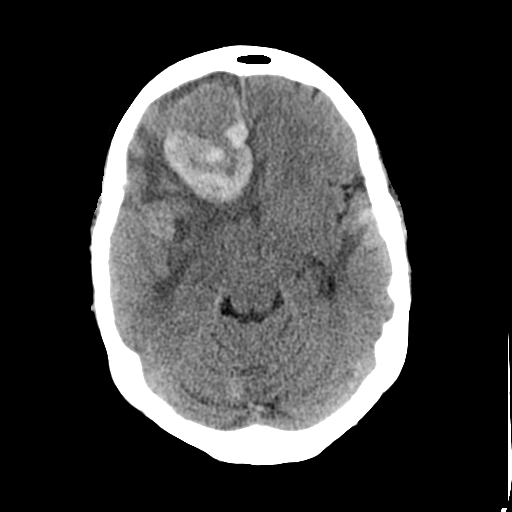
[im 10/30  bone]
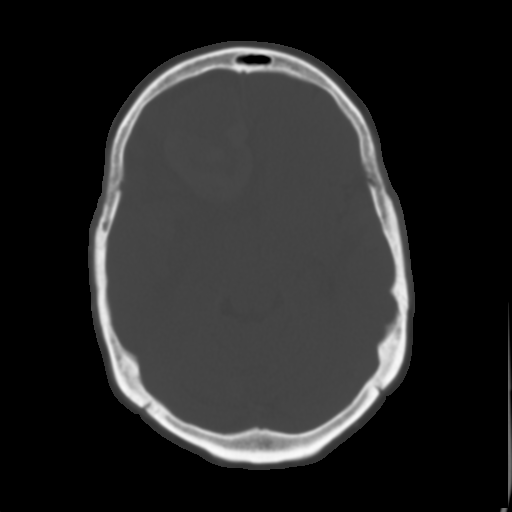
[im 12/30  brain]
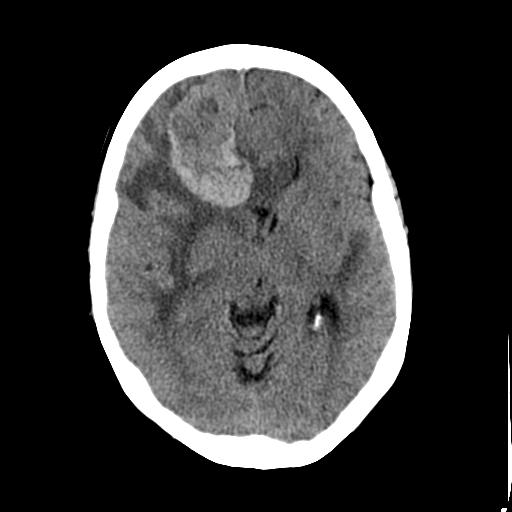
[im 14/30  brain]
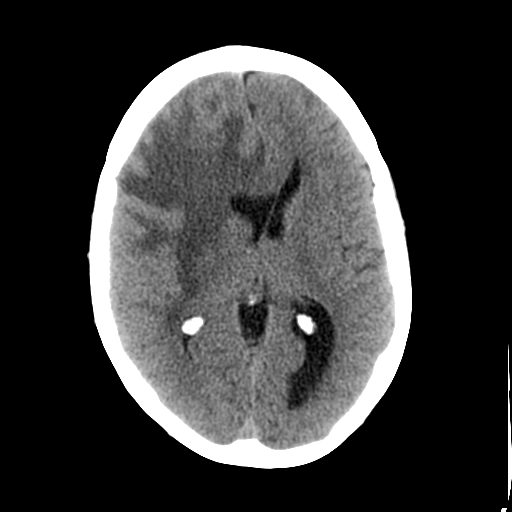
[im 16/30  brain]
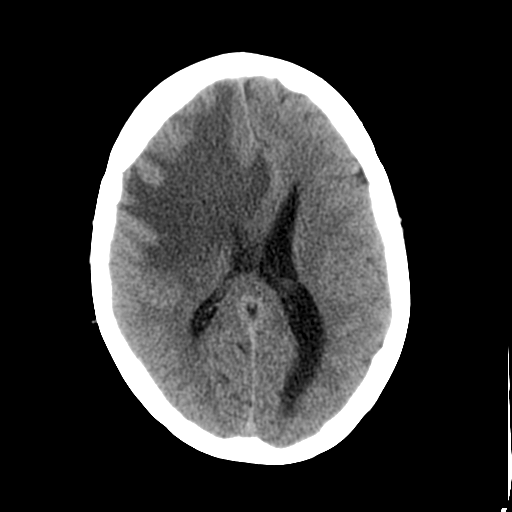
[im 17/30  brain]
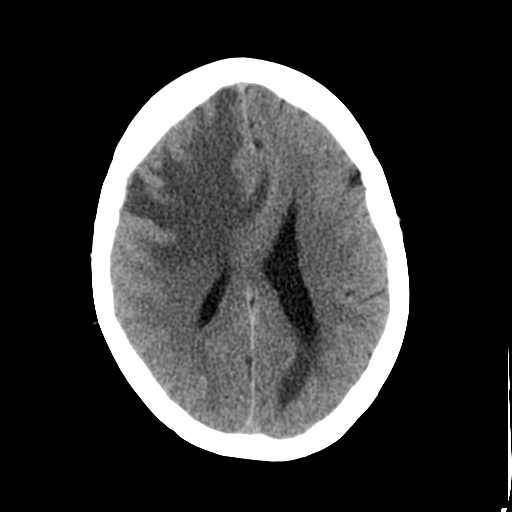
[im 17/30  bone]
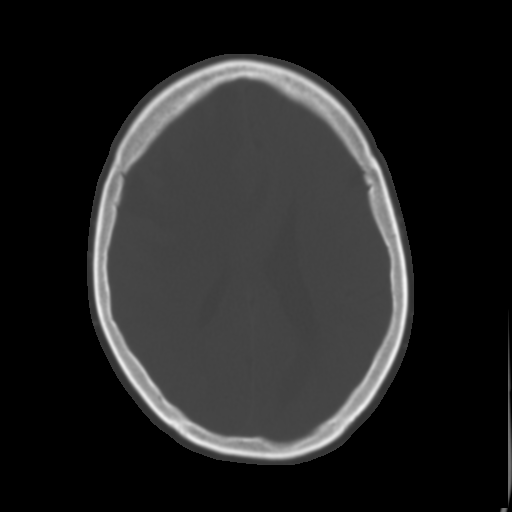
[im 19/30  brain]
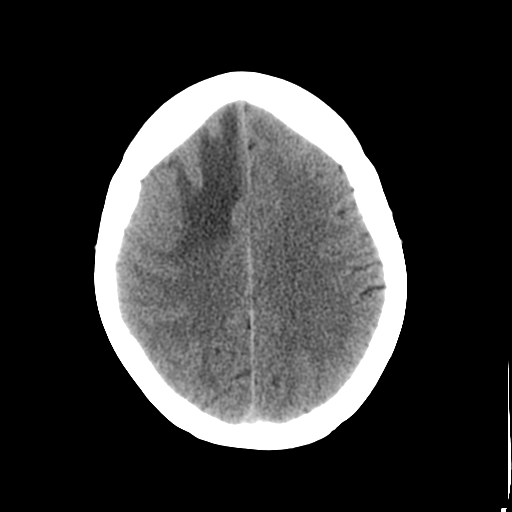
[im 21/30  brain]
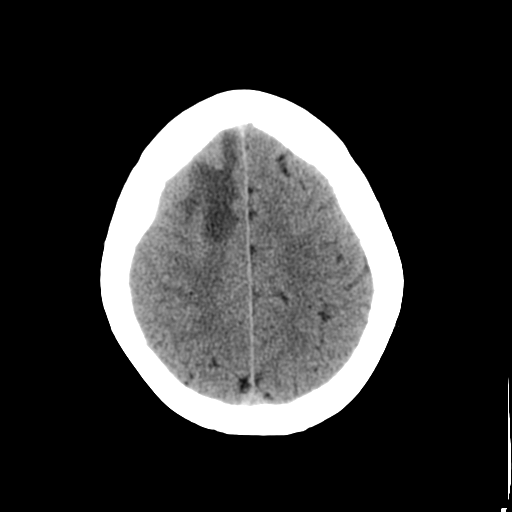
[im 23/30  brain]
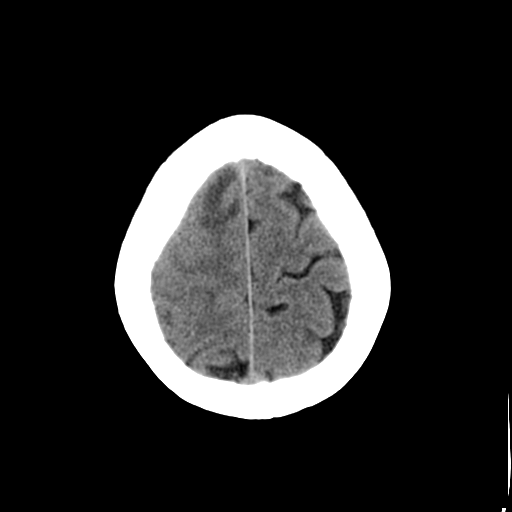
[im 25/30  brain]
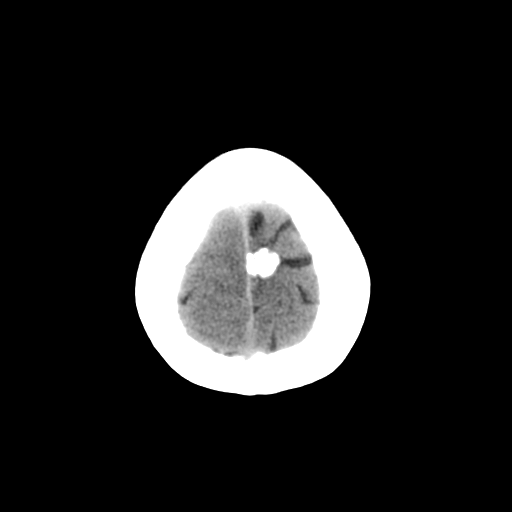
[im 25/30  bone]
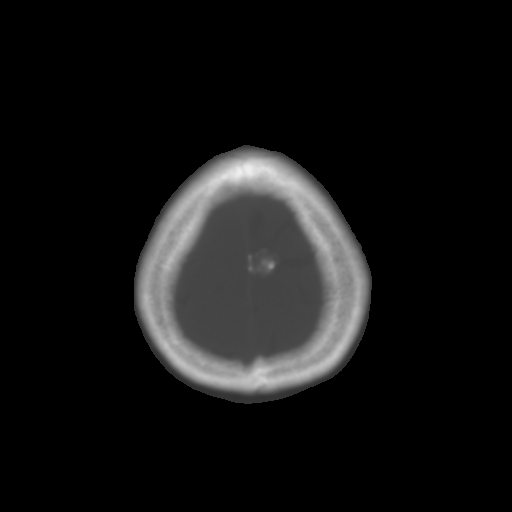
[im 27/30  brain]
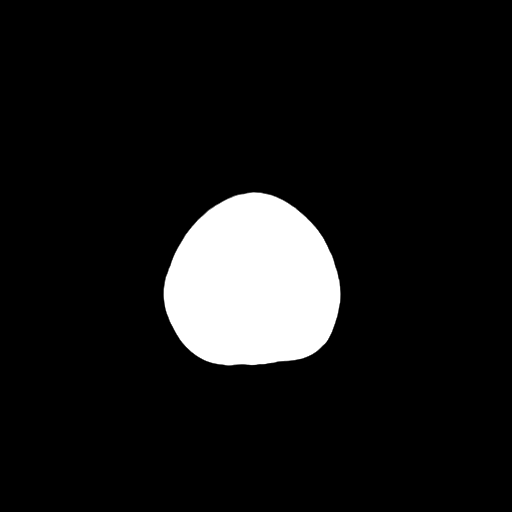
[im 29/30  brain]
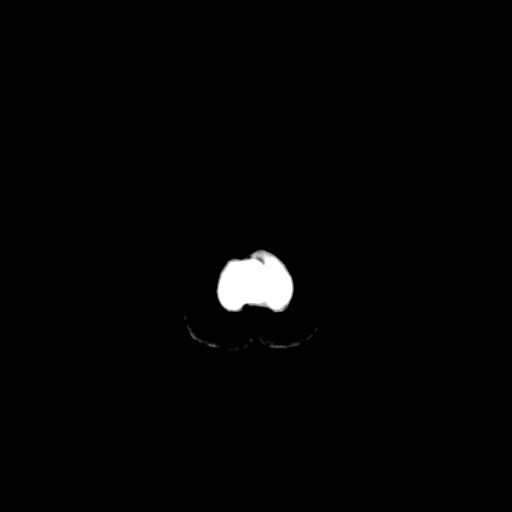

[15 of 30 positions shown; findings below may reference images not displayed]

FINDINGS: Skull and Sinuses:Negative for fracture or destructive process. The
mastoids, middle ears, and imaged paranasal sinuses are clear.

Orbits: No acute abnormality.

Brain: There are at least 4 hyper dense intra-axial masses, the
largest in the right frontal lobe measuring 34 mm. This mass is
complicated by hematoma which extends posteriorly into the right
frontal white matter, measuring 28 x 39 x 30 mm. There is also an 18
mm mass in the right temporal lobe, an indistinct mass in the left
temporal lobe, and a 9 mm mass in the upper right cerebellum. Patchy
vasogenic edema, maximal around the hemorrhagic lesion on the right.
There is mass effect and midline shift measuring up to 13 mm. No ACA
infarct. The left temporal horn is mildly dilated, but no overt
hydrocephalus.

Critical Value/emergent results were called by telephone at the time
of interpretation on 06/28/2014 at [DATE] to Dr. RUIMAN KLK , who
verbally acknowledged these results.
IMPRESSION: 1. At least 4 brain masses, a metastatic pattern. A 34 mm mass in
the right frontal lobe has undergone acute hemorrhage with a 16 cc
volume hematoma.
2. Patchy vasogenic edema from #1, maximal in the right frontal lobe
with subfalcine herniation and developing left lateral ventricle
entrapment.
3. 13 mm calcified left parafalcine meningioma.

## 2016-04-22 IMAGING — CT CT CHEST W/ CM
2 of 5 series · 14 of 36 positions shown, 17 images · IV contrast (Omni 300)
Comparison: None.

CLINICAL DATA: Brain mass.

EXAM:
CT CHEST, ABDOMEN, AND PELVIS WITH CONTRAST
TECHNIQUE: Multidetector CT imaging of the chest, abdomen and pelvis was
performed following the standard protocol during bolus
administration of intravenous contrast.
CONTRAST:  100mL OMNIPAQUE IOHEXOL 300 MG/ML  SOLN

[Series 2: cap 5.0 i31f 1 · axial · 0.74mm/px · z∈[-620,-85]mm · 11 of 125 slices shown, 14 images]
[im 9/125  mediastinal]
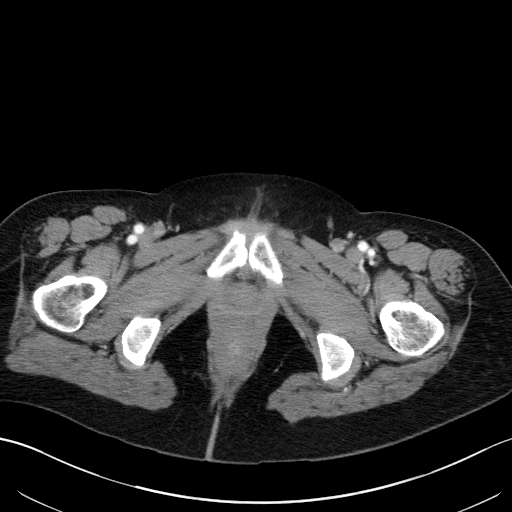
[im 9/125  lung]
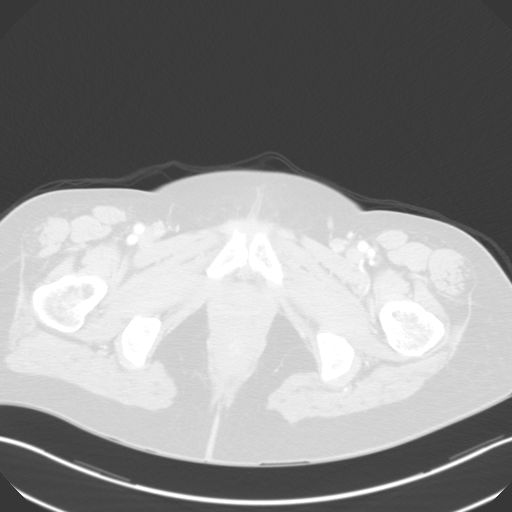
[im 17/125  lung]
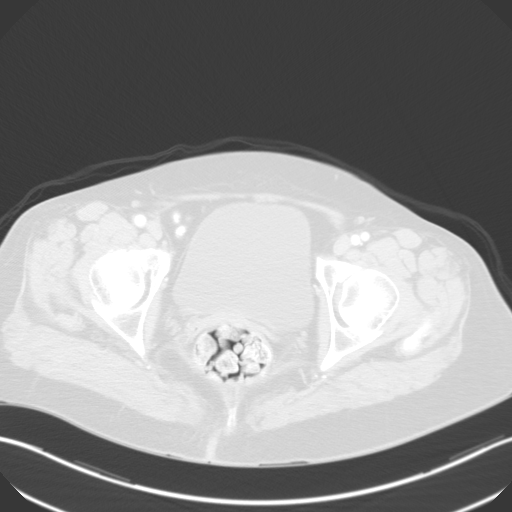
[im 34/125  lung]
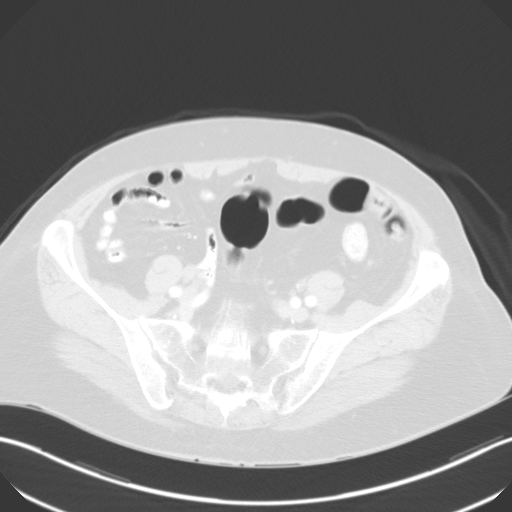
[im 42/125  lung]
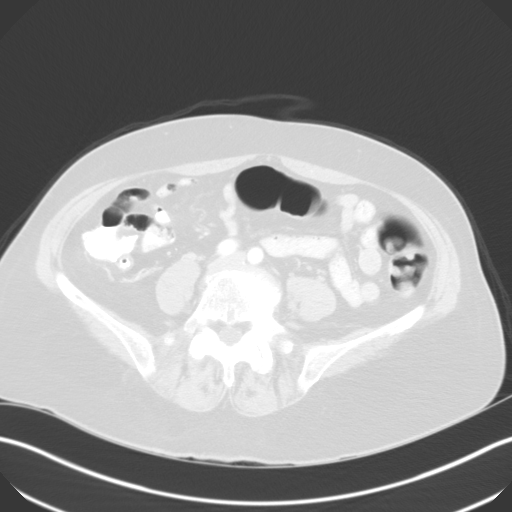
[im 50/125  mediastinal]
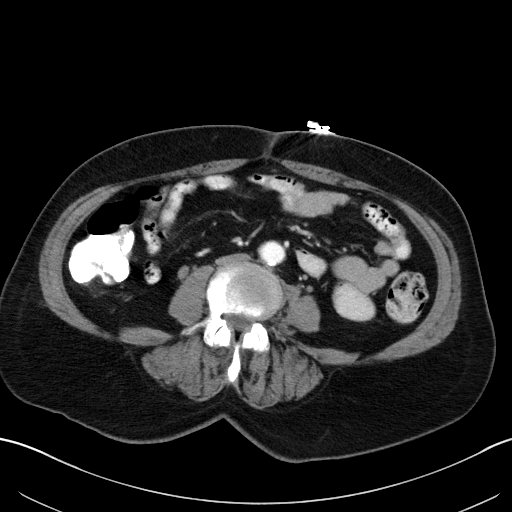
[im 50/125  lung]
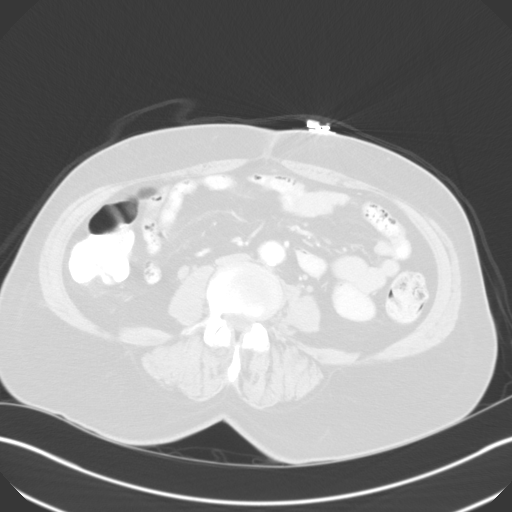
[im 67/125  lung]
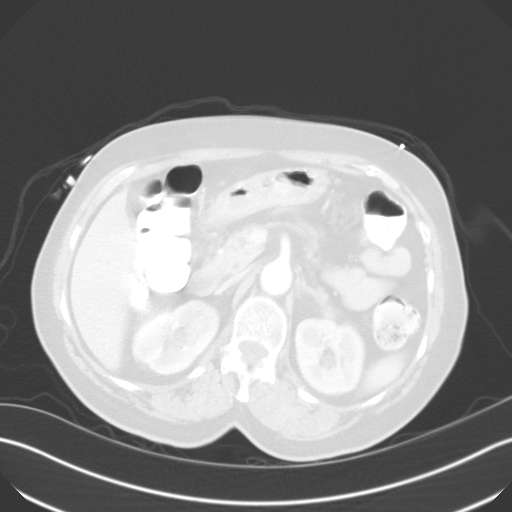
[im 75/125  lung]
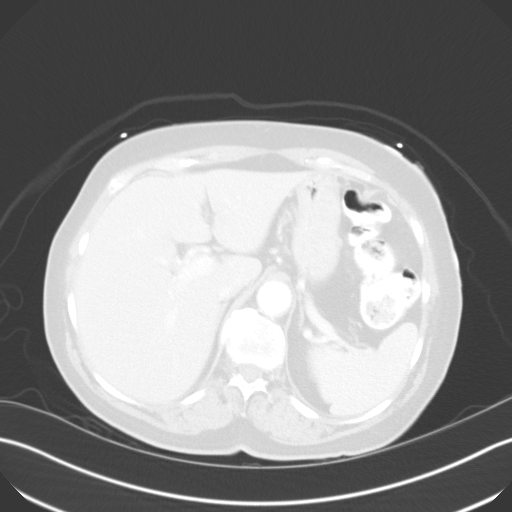
[im 83/125  lung]
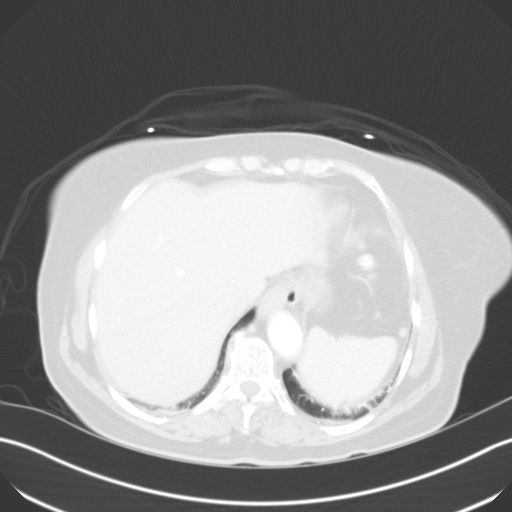
[im 91/125  mediastinal]
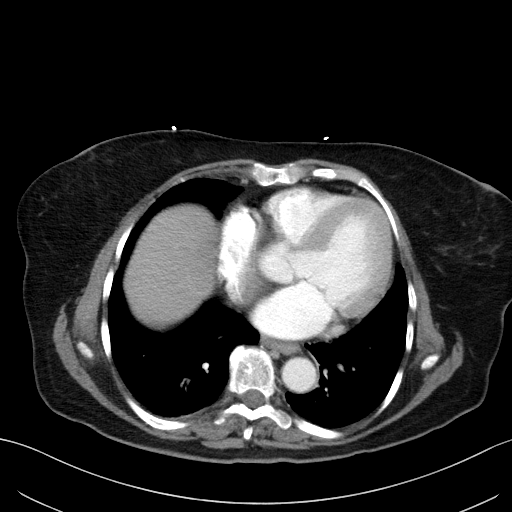
[im 91/125  lung]
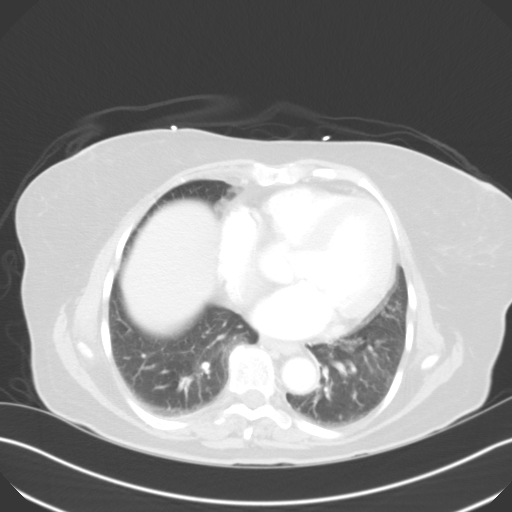
[im 108/125  lung]
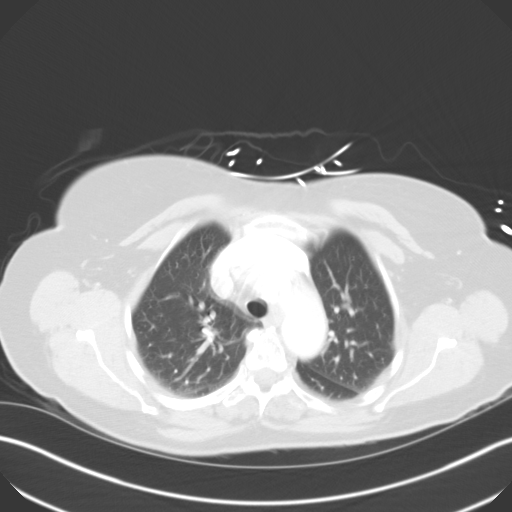
[im 116/125  lung]
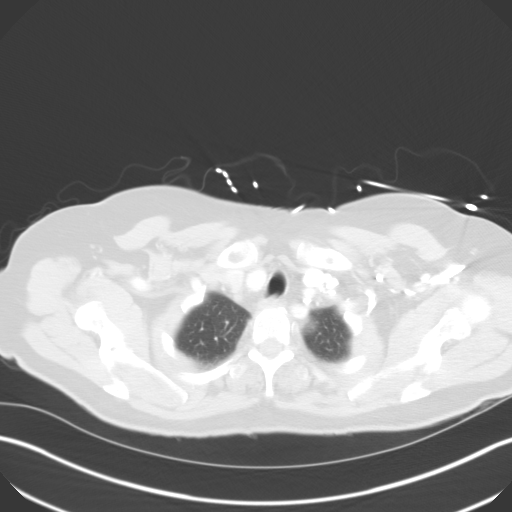

[Series 5: coronal · coronal · 0.67mm/px · 3 of 90 slices shown]
[im 18/90  lung]
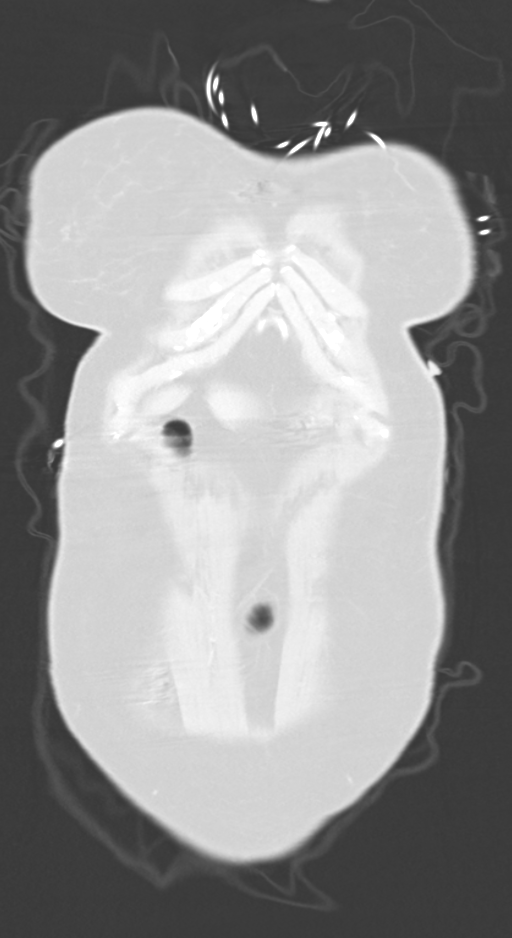
[im 36/90  lung]
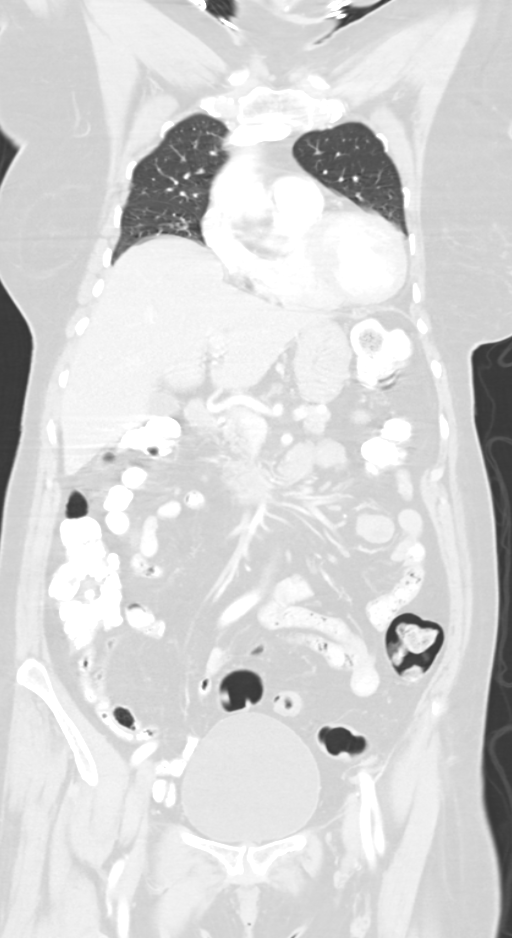
[im 54/90  lung]
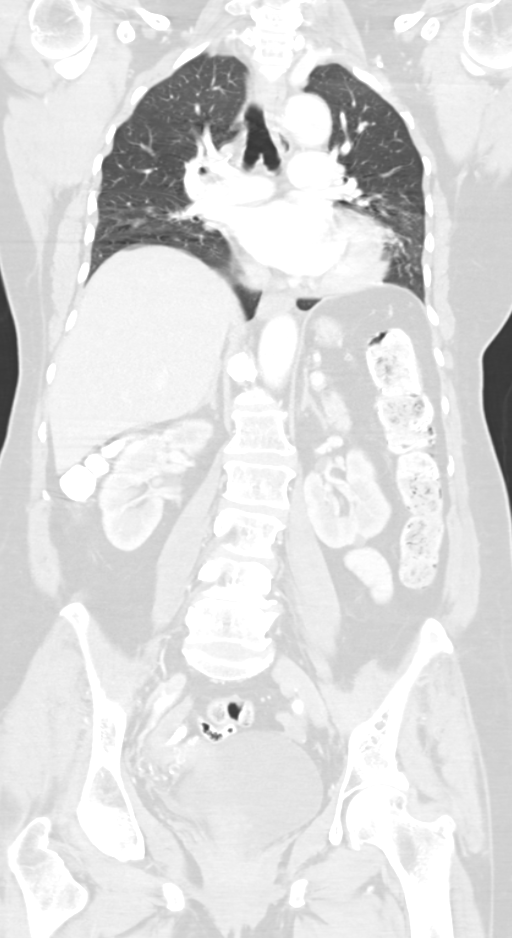

[14 of 36 positions shown; findings below may reference images not displayed]

FINDINGS: CT CHEST FINDINGS

THORACIC INLET/BODY WALL:

No acute abnormality.

MEDIASTINUM:

Mild cardiomegaly. There is fusiform aneurysmal enlargement of the
ascending aorta to 41 mm. No acute vascular findings. No
lymphadenopathy. Negative esophagus.

LUNG WINDOWS:

No suspicious pulmonary nodule.

OSSEOUS:

No suspicious lytic or blastic lesions.

CT ABDOMEN AND PELVIS FINDINGS

BODY WALL: No contributory findings.

Liver: No focal abnormality.

Biliary: No evidence of biliary obstruction or stone.

Pancreas: Unremarkable.

Spleen: Unremarkable.

Adrenals: Unremarkable.

Kidneys and ureters: No evidence of solid renal mass. There is a 42
mm cyst in the lower pole left kidney. Right renal cortical low
densities are too small to characterize but favor cyst. No
hydronephrosis.

Bladder: Unremarkable.

Reproductive: No pathologic findings.

Bowel: Negative.

Retroperitoneum: No mass or adenopathy.

Peritoneum: No ascites or pneumoperitoneum.

Vascular: No acute abnormality.

OSSEOUS:

No suspicious lytic or blastic lesions.
IMPRESSION: 1. No evidence of primary malignancy or metastatic disease in the
chest or abdomen.
2. Fusiform aneurysm of the ascending aorta, mild at 41 mm diameter.
